# Patient Record
Sex: Female | Born: 1990 | Race: Black or African American | Hispanic: No | Marital: Single | Smoking: Never smoker
Health system: Southern US, Community
[De-identification: ages and names within clinical notes are randomized; demographics above are authoritative.]

## PROBLEM LIST (undated history)

## (undated) DIAGNOSIS — J45909 Unspecified asthma, uncomplicated: Secondary | ICD-10-CM

## (undated) HISTORY — PX: ADENOIDECTOMY: SUR15

## (undated) HISTORY — PX: TONSILLECTOMY: SUR1361

---

## 2017-08-10 ENCOUNTER — Encounter: Payer: Self-pay | Admitting: Physician Assistant

## 2017-08-10 ENCOUNTER — Ambulatory Visit: Payer: Self-pay | Admitting: Physician Assistant

## 2017-08-10 VITALS — BP 100/70 | HR 73 | Temp 98.5°F

## 2017-08-10 DIAGNOSIS — S8001XA Contusion of right knee, initial encounter: Secondary | ICD-10-CM

## 2017-08-10 DIAGNOSIS — S8011XA Contusion of right lower leg, initial encounter: Principal | ICD-10-CM

## 2017-08-10 NOTE — Progress Notes (Signed)
S: pt states she fell down steps at home, leg got caught b/n 2 metal rails, now its bruised and swollen, did use ice last night, no other injury, no bcp use, nonsmoker  O: vitals wnl nad, skin is bruised and tender in posterior to medial aspect of r knee, no bony tenderness, n/v intact, knee brace applied  A: contusion  P: ice, advil, elevate, wear knee brace as needed, if leg becomes more swollen or painful go to the ER or return here and will order US of lower leg, pt states she understands and knows the risk of not going to ER or here if worsening

## 2019-05-22 ENCOUNTER — Other Ambulatory Visit: Payer: Self-pay

## 2019-05-22 DIAGNOSIS — Z20822 Contact with and (suspected) exposure to covid-19: Secondary | ICD-10-CM

## 2019-05-23 LAB — NOVEL CORONAVIRUS, NAA: SARS-CoV-2, NAA: NOT DETECTED

## 2019-11-27 ENCOUNTER — Encounter: Payer: Self-pay | Admitting: Nurse Practitioner

## 2019-11-27 ENCOUNTER — Other Ambulatory Visit: Payer: Self-pay

## 2019-11-27 ENCOUNTER — Ambulatory Visit: Payer: Managed Care, Other (non HMO) | Admitting: Nurse Practitioner

## 2019-11-27 VITALS — BP 110/80 | HR 73 | Temp 98.4°F | Ht 64.0 in | Wt 161.2 lb

## 2019-11-27 DIAGNOSIS — Z0000 Encounter for general adult medical examination without abnormal findings: Secondary | ICD-10-CM | POA: Diagnosis not present

## 2019-11-27 DIAGNOSIS — R1032 Left lower quadrant pain: Secondary | ICD-10-CM | POA: Diagnosis not present

## 2019-11-27 DIAGNOSIS — R319 Hematuria, unspecified: Secondary | ICD-10-CM

## 2019-11-27 DIAGNOSIS — Z113 Encounter for screening for infections with a predominantly sexual mode of transmission: Secondary | ICD-10-CM

## 2019-11-27 LAB — POCT URINALYSIS DIPSTICK
Bilirubin, UA: NEGATIVE
Glucose, UA: NEGATIVE
Ketones, UA: NEGATIVE
Leukocytes, UA: NEGATIVE
Nitrite, UA: NEGATIVE
Protein, UA: NEGATIVE
Spec Grav, UA: 1.02 (ref 1.010–1.025)
Urobilinogen, UA: 0.2 E.U./dL
pH, UA: 6 (ref 5.0–8.0)

## 2019-11-27 NOTE — Progress Notes (Signed)
This visit occurred during the SARS-CoV-2 public health emergency.  Safety protocols were in place, including screening questions prior to the visit, additional usage of staff PPE, and extensive cleaning of exam room while observing appropriate contact time as indicated for disinfecting solutions.  Subjective:     Patient ID: Destiny Ware , female    DOB: 03-Feb-1991 , 29 y.o.   MRN: [id number]   Chief Complaint  Patient presents with  . Establish Care  . Annual Exam    HPI  Here to establish care. She found [place] through her insurance.  She is originally from [facility], has been in the area for 4 years. Works in an office.  Single, no children.  College education for 1 year.   PMH - none  FMH - mother and father both healthy.  2 sisters and 2 brothers all healthy. Paternal grandmother - possible HTN.   She had left groin muscle pain. Pain was present for 1 week.  Occurred upon awakening. Was seen at [facility].  She would like to have her annual physical today    The patient states she uses none for birth control. Last LMP was Patient's last menstrual period was 11/07/2019.. Negative for Dysmenorrhea and Negative for Menorrhagia.  Negative for: breast discharge, breast lump(s), breast pain and breast self exam.  Pertinent negatives include abnormal bleeding (hematology), anxiety, decreased libido, depression, difficulty falling sleep, dyspareunia, history of infertility, nocturia, sexual dysfunction, sleep disturbances, urinary incontinence, urinary urgency, vaginal discharge and vaginal itching. Diet regular. The patient states her exercise level is     The patient's tobacco use is:  Social History   Tobacco Use  Smoking Status Never Smoker  Smokeless Tobacco Never Used   She has been exposed to passive smoke. The patient's alcohol use is:  Social History   Substance and Sexual Activity  Alcohol Use Yes   Comment: occasionally   Additional information: Last pap  unknown,   History reviewed. No pertinent past medical history.   Family History  Problem Relation Age of Onset  . Healthy Mother   . Healthy Father   . Healthy Sister   . Healthy Sister   . Healthy Brother   . Healthy Brother     No current outpatient medications on file.   No Known Allergies   Review of Systems  Constitutional: Negative.   HENT: Negative.   Eyes: Negative.   Respiratory: Negative.   Cardiovascular: Negative.   Gastrointestinal: Negative.   Endocrine: Negative.   Genitourinary: Negative.   Musculoskeletal: Negative.   Skin: Negative.   Allergic/Immunologic: Negative.   Neurological: Negative.   Hematological: Negative.   Psychiatric/Behavioral: Negative.      Today's Vitals   11/27/19 1501  BP: 110/80  Pulse: 73  Temp: 98.4 F (36.9 C)  TempSrc: Oral  SpO2: 98%  Weight: 161 lb 3.2 oz (73.1 kg)  Height: 5\' 4"  (1.626 m)  PainSc: 0-No pain   Body mass index is 27.67 kg/m.   Objective:  Physical Exam Constitutional:      Appearance: Normal appearance. She is well-developed.  HENT:     Head: Normocephalic and atraumatic.     Right Ear: Hearing, tympanic membrane, ear canal and external ear normal. There is no impacted cerumen.     Left Ear: Hearing, tympanic membrane, ear canal and external ear normal. There is no impacted cerumen.     Nose:     Comments: Deferred - masked    Mouth/Throat:  Comments: Deferred - masked Eyes:     General: Lids are normal.     Extraocular Movements: Extraocular movements intact.     Conjunctiva/sclera: Conjunctivae normal.     Pupils: Pupils are equal, round, and reactive to light.     Funduscopic exam:    Right eye: No papilledema.        Left eye: No papilledema.  Neck:     Thyroid: No thyroid mass.     Vascular: No carotid bruit.  Cardiovascular:     Rate and Rhythm: Normal rate and regular rhythm.     Pulses: Normal pulses.     Heart sounds: Normal heart sounds. No murmur.  Pulmonary:      Effort: Pulmonary effort is normal.     Breath sounds: Normal breath sounds.  Chest:     Breasts:        Right: Normal. No mass or tenderness.        Left: Normal. No mass or tenderness.  Abdominal:     General: Abdomen is flat. Bowel sounds are normal.     Palpations: Abdomen is soft.  Musculoskeletal:        General: No swelling. Normal range of motion.     Cervical back: Full passive range of motion without pain, normal range of motion and neck supple.     Right lower leg: No edema.     Left lower leg: No edema.  Lymphadenopathy:     Upper Body:     Right upper body: No supraclavicular adenopathy.     Left upper body: No supraclavicular adenopathy.  Skin:    General: Skin is warm and dry.     Capillary Refill: Capillary refill takes less than 2 seconds.  Neurological:     General: No focal deficit present.     Mental Status: She is alert and oriented to person, place, and time.     Cranial Nerves: No cranial nerve deficit.     Sensory: No sensory deficit.  Psychiatric:        Mood and Affect: Mood normal.        Behavior: Behavior normal.        Thought Content: Thought content normal.        Judgment: Judgment normal.         Assessment And Plan:     1. Health maintenance examination . Behavior modifications discussed and diet history reviewed.   . Pt will continue to exercise regularly and modify diet with low GI, plant based foods and decrease intake of processed foods.  . Recommend intake of daily multivitamin, Vitamin D, and calcium.  . Recommend routine breast self exams monthly for preventive screenings, as well as recommend immunizations that include influenza, TDAP (she will try to get a copy of her records) . Declined to have a PAP today  2. Left groin pain  No pain on palpation  Does have moderate blood in urine.  Will recheck at next visit   Minette Brine, FNP    THE PATIENT IS ENCOURAGED TO PRACTICE SOCIAL DISTANCING DUE TO THE COVID-19 PANDEMIC.

## 2019-11-27 NOTE — Patient Instructions (Signed)
Health Maintenance  Topic Date Due  . HIV Screening  04/04/2007  . TETANUS/TDAP  04/04/2011  . PAP-Cervical Cytology Screening  04/03/2013  . PAP SMEAR-Modifier  04/03/2013  . INFLUENZA VACCINE  01/14/2020 (Originally 05/17/2019)   Health Maintenance, Female Adopting a healthy lifestyle and getting preventive care are important in promoting health and wellness. Ask your health care provider about:  The right schedule for you to have regular tests and exams.  Things you can do on your own to prevent diseases and keep yourself healthy. What should I know about diet, weight, and exercise? Eat a healthy diet   Eat a diet that includes plenty of vegetables, fruits, low-fat dairy products, and lean protein.  Do not eat a lot of foods that are high in solid fats, added sugars, or sodium. Maintain a healthy weight Body mass index (BMI) is used to identify weight problems. It estimates body fat based on height and weight. Your health care provider can help determine your BMI and help you achieve or maintain a healthy weight. Get regular exercise Get regular exercise. This is one of the most important things you can do for your health. Most adults should:  Exercise for at least 150 minutes each week. The exercise should increase your heart rate and make you sweat (moderate-intensity exercise).  Do strengthening exercises at least twice a week. This is in addition to the moderate-intensity exercise.  Spend less time sitting. Even light physical activity can be beneficial. Watch cholesterol and blood lipids Have your blood tested for lipids and cholesterol at 28 years of age, then have this test every 5 years. Have your cholesterol levels checked more often if:  Your lipid or cholesterol levels are high.  You are older than 28 years of age.  You are at high risk for heart disease. What should I know about cancer screening? Depending on your health history and family history, you may need  to have cancer screening at various ages. This may include screening for:  Breast cancer.  Cervical cancer.  Colorectal cancer.  Skin cancer.  Lung cancer. What should I know about heart disease, diabetes, and high blood pressure? Blood pressure and heart disease  High blood pressure causes heart disease and increases the risk of stroke. This is more likely to develop in people who have high blood pressure readings, are of African descent, or are overweight.  Have your blood pressure checked: ? Every 3-5 years if you are 75-14 years of age. ? Every year if you are 76 years old or older. Diabetes Have regular diabetes screenings. This checks your fasting blood sugar level. Have the screening done:  Once every three years after age 46 if you are at a normal weight and have a low risk for diabetes.  More often and at a younger age if you are overweight or have a high risk for diabetes. What should I know about preventing infection? Hepatitis B If you have a higher risk for hepatitis B, you should be screened for this virus. Talk with your health care provider to find out if you are at risk for hepatitis B infection. Hepatitis C Testing is recommended for:  Everyone born from [birth year] through 1964.  Anyone with known risk factors for hepatitis C. Sexually transmitted infections (STIs)  Get screened for STIs, including gonorrhea and chlamydia, if: ? You are sexually active and are younger than 28 years of age. ? You are older than 28 years of age and your health care  provider tells you that you are at risk for this type of infection. ? Your sexual activity has changed since you were last screened, and you are at increased risk for chlamydia or gonorrhea. Ask your health care provider if you are at risk.  Ask your health care provider about whether you are at high risk for HIV. Your health care provider may recommend a prescription medicine to help prevent HIV infection. If you choose  to take medicine to prevent HIV, you should first get tested for HIV. You should then be tested every 3 months for as long as you are taking the medicine. Pregnancy  If you are about to stop having your period (premenopausal) and you may become pregnant, seek counseling before you get pregnant.  Take 400 to 800 micrograms (mcg) of folic acid every day if you become pregnant.  Ask for birth control (contraception) if you want to prevent pregnancy. Osteoporosis and menopause Osteoporosis is a disease in which the bones lose minerals and strength with aging. This can result in bone fractures. If you are 30 years old or older, or if you are at risk for osteoporosis and fractures, ask your health care provider if you should:  Be screened for bone loss.  Take a calcium or vitamin D supplement to lower your risk of fractures.  Be given hormone replacement therapy (HRT) to treat symptoms of menopause. Follow these instructions at home: Lifestyle  Do not use any products that contain nicotine or tobacco, such as cigarettes, e-cigarettes, and chewing tobacco. If you need help quitting, ask your health care provider.  Do not use street drugs.  Do not share needles.  Ask your health care provider for help if you need support or information about quitting drugs. Alcohol use  Do not drink alcohol if: ? Your health care provider tells you not to drink. ? You are pregnant, may be pregnant, or are planning to become pregnant.  If you drink alcohol: ? Limit how much you use to 0-1 drink a day. ? Limit intake if you are breastfeeding.  Be aware of how much alcohol is in your drink. In the [place] one drink equals one 12 oz bottle of beer (355 mL), one 5 oz glass of wine (148 mL), or one 1 oz glass of hard liquor (44 mL). General instructions  Schedule regular health, dental, and eye exams.  Stay current with your vaccines.  Tell your health care provider if: ? You often feel depressed. ? You  have ever been abused or do not feel safe at home. Summary  Adopting a healthy lifestyle and getting preventive care are important in promoting health and wellness.  Follow your health care provider's instructions about healthy diet, exercising, and getting tested or screened for diseases.  Follow your health care provider's instructions on monitoring your cholesterol and blood pressure. This information is not intended to replace advice given to you by your health care provider. Make sure you discuss any questions you have with your health care provider. Document Revised: 09/25/2018 Document Reviewed: 09/25/2018 Elsevier Patient Education  2020 [employer].

## 2019-11-28 LAB — HIV ANTIBODY (ROUTINE TESTING W REFLEX): HIV Screen 4th Generation wRfx: NONREACTIVE

## 2019-11-28 LAB — CMP14+EGFR
ALT: 9 IU/L (ref 0–32)
AST: 13 IU/L (ref 0–40)
Albumin/Globulin Ratio: 2.1 (ref 1.2–2.2)
Albumin: 4.6 g/dL (ref 3.9–5.0)
Alkaline Phosphatase: 76 IU/L (ref 39–117)
BUN/Creatinine Ratio: 10 (ref 9–23)
BUN: 10 mg/dL (ref 6–20)
Bilirubin Total: 0.6 mg/dL (ref 0.0–1.2)
CO2: 22 mmol/L (ref 20–29)
Calcium: 9.6 mg/dL (ref 8.7–10.2)
Chloride: 105 mmol/L (ref 96–106)
Creatinine, Ser: 0.97 mg/dL (ref 0.57–1.00)
GFR calc Af Amer: 93 mL/min/{1.73_m2} (ref >59)
GFR calc non Af Amer: 80 mL/min/{1.73_m2} (ref >59)
Globulin, Total: 2.2 g/dL (ref 1.5–4.5)
Glucose: 97 mg/dL (ref 65–99)
Potassium: 4.6 mmol/L (ref 3.5–5.2)
Sodium: 141 mmol/L (ref 134–144)
Total Protein: 6.8 g/dL (ref 6.0–8.5)

## 2019-11-28 LAB — URINE CULTURE

## 2019-11-28 LAB — CBC
Hematocrit: 37.7 % (ref 34.0–46.6)
Hemoglobin: 12.8 g/dL (ref 11.1–15.9)
MCH: 30.8 pg (ref 26.6–33.0)
MCHC: 34 g/dL (ref 31.5–35.7)
MCV: 91 fL (ref 79–97)
Platelets: 203 10*3/uL (ref 150–450)
RBC: 4.15 x10E6/uL (ref 3.77–5.28)
RDW: 12.6 % (ref 11.7–15.4)
WBC: 4.4 10*3/uL (ref 3.4–10.8)

## 2019-11-28 LAB — LIPID PANEL
Chol/HDL Ratio: 1.9 ratio (ref 0.0–4.4)
Cholesterol, Total: 163 mg/dL (ref 100–199)
HDL: 87 mg/dL (ref >39)
LDL Chol Calc (NIH): 66 mg/dL (ref 0–99)
Triglycerides: 44 mg/dL (ref 0–149)
VLDL Cholesterol Cal: 10 mg/dL (ref 5–40)

## 2019-11-28 LAB — VITAMIN D 25 HYDROXY (VIT D DEFICIENCY, FRACTURES): Vit D, 25-Hydroxy: 6.8 ng/mL — ABNORMAL LOW (ref 30.0–100.0)

## 2019-12-01 MED ORDER — VITAMIN D (ERGOCALCIFEROL) 1.25 MG (50000 UNIT) PO CAPS: 50000.0000 [IU] | ORAL_CAPSULE | ORAL | 1 refills | Status: DC

## 2020-06-23 ENCOUNTER — Encounter: Payer: Self-pay | Admitting: Nurse Practitioner

## 2020-06-23 ENCOUNTER — Ambulatory Visit (INDEPENDENT_AMBULATORY_CARE_PROVIDER_SITE_OTHER): Payer: Managed Care, Other (non HMO) | Admitting: Nurse Practitioner

## 2020-06-23 ENCOUNTER — Other Ambulatory Visit: Payer: Self-pay

## 2020-06-23 VITALS — BP 118/74 | HR 86 | Temp 98.2°F | Ht 64.0 in | Wt 169.0 lb

## 2020-06-23 DIAGNOSIS — R0602 Shortness of breath: Secondary | ICD-10-CM | POA: Diagnosis not present

## 2020-06-23 DIAGNOSIS — E559 Vitamin D deficiency, unspecified: Secondary | ICD-10-CM | POA: Diagnosis not present

## 2020-06-23 MED ORDER — ALBUTEROL SULFATE HFA 108 (90 BASE) MCG/ACT IN AERS: 2.0000 | INHALATION_SPRAY | Freq: Four times a day (QID) | RESPIRATORY_TRACT | 2 refills | Status: AC | PRN

## 2020-06-23 NOTE — Progress Notes (Signed)
This visit occurred during the SARS-CoV-2 public health emergency.  Safety protocols were in place, including screening questions prior to the visit, additional usage of staff PPE, and extensive cleaning of exam room while observing appropriate contact time as indicated for disinfecting solutions.  Subjective:     Patient ID: Destiny Ware , female    DOB: 03-04-1991 , 29 y.o.   MRN: [id number]   Chief Complaint  Patient presents with  . Shortness of Breath    HPI  Reports occurs sitting and on exertion.  She report having random chest discomfort but does not last long. She has had both covid vaccines with the last one she thinks in May.  She reports having some wheezing. Also reports family history of asthma  Shortness of Breath This is a new problem. The current episode started in the past 7 days (since Saturday). The problem occurs constantly. Pertinent negatives include no abdominal pain, chest pain, fever, headaches, leg swelling, orthopnea, sore throat, sputum production or syncope. She has tried nothing for the symptoms. There is no history of allergies or asthma.     History reviewed. No pertinent past medical history.   Family History  Problem Relation Age of Onset  . Healthy Mother   . Healthy Father   . Healthy Sister   . Healthy Sister   . Healthy Brother   . Healthy Brother      Current Outpatient Medications:  .  albuterol (VENTOLIN HFA) 108 (90 Base) MCG/ACT inhaler, Inhale 2 puffs into the lungs every 6 (six) hours as needed for wheezing or shortness of breath., Disp: 8 g, Rfl: 2 .  Vitamin D, Ergocalciferol, (DRISDOL) 1.25 MG (50000 UNIT) CAPS capsule, Take 1 capsule (50,000 Units total) by mouth 2 (two) times a week. (Patient not taking: Reported on 06/23/2020), Disp: 24 capsule, Rfl: 1   No Known Allergies   Review of Systems  Constitutional: Negative for fever.  HENT: Negative for sore throat.   Respiratory: Positive for shortness of breath. Negative for  sputum production.   Cardiovascular: Negative for chest pain, orthopnea, leg swelling and syncope.  Gastrointestinal: Negative for abdominal pain.  Neurological: Negative for dizziness and headaches.     Today's Vitals   06/23/20 1606  BP: 118/74  Pulse: 86  Temp: 98.2 F (36.8 C)  TempSrc: Oral  SpO2: 99%  Weight: 169 lb (76.7 kg)  Height: 5\' 4"  (1.626 m)  PainSc: 0-No pain   Body mass index is 29.01 kg/m.   Objective:  Physical Exam Vitals reviewed.  Constitutional:      Appearance: She is well-developed.  Pulmonary:     Breath sounds: No decreased breath sounds or wheezing.  Neurological:     General: No focal deficit present.     Mental Status: She is alert.     Cranial Nerves: No cranial nerve deficit.  Psychiatric:        Mood and Affect: Mood normal.        Behavior: Behavior normal.         Assessment And Plan:     1. Shortness of breath  No abnormal findings on physical exam   She is to go for a chest xray to evaluate further  She is to go after she receives the results of her covid test - EKG 12-Lead - Novel Coronavirus, NAA ([facility]) - DG Chest 2 View; Future - albuterol (VENTOLIN HFA) 108 (90 Base) MCG/ACT inhaler; Inhale 2 puffs into the lungs every 6 (six) hours  as needed for wheezing or shortness of breath.  Dispense: 8 g; Refill: 2 - Vitamin D (25 hydroxy)  2. Vitamin D deficiency  Will check vitamin D level and supplement as needed.     Also encouraged to spend 15 minutes in the sun daily.     Patient was given opportunity to ask questions. Patient verbalized understanding of the plan and was able to repeat key elements of the plan. All questions were answered to their satisfaction.   Jeanell Sparrow, FNP, have reviewed all documentation for this visit. The documentation on 06/23/20 for the exam, diagnosis, procedures, and orders are all accurate and complete.  THE PATIENT IS ENCOURAGED TO PRACTICE SOCIAL DISTANCING DUE TO THE COVID-19  PANDEMIC.

## 2020-06-23 NOTE — Patient Instructions (Signed)
Stay home in isolation until you have a negative Covid test.   Go to chest xray at [facility] at [address].

## 2020-06-24 ENCOUNTER — Other Ambulatory Visit: Payer: Self-pay | Admitting: Nurse Practitioner

## 2020-06-24 DIAGNOSIS — E559 Vitamin D deficiency, unspecified: Secondary | ICD-10-CM

## 2020-06-24 LAB — VITAMIN D 25 HYDROXY (VIT D DEFICIENCY, FRACTURES): Vit D, 25-Hydroxy: 61.1 ng/mL (ref 30.0–100.0)

## 2020-06-24 MED ORDER — VITAMIN D (ERGOCALCIFEROL) 1.25 MG (50000 UNIT) PO CAPS: 50000.0000 [IU] | ORAL_CAPSULE | ORAL | 1 refills | Status: DC

## 2020-06-25 LAB — SARS-COV-2, NAA 2 DAY TAT

## 2020-06-25 LAB — NOVEL CORONAVIRUS, NAA: SARS-CoV-2, NAA: NOT DETECTED

## 2020-09-11 ENCOUNTER — Ambulatory Visit (HOSPITAL_COMMUNITY)
Admission: EM | Admit: 2020-09-11 | Discharge: 2020-09-11 | Disposition: A | Discharge status: Home / Self Care | Payer: Managed Care, Other (non HMO) | Attending: Family Medicine | Admitting: Family Medicine

## 2020-09-11 ENCOUNTER — Encounter (HOSPITAL_COMMUNITY): Payer: Self-pay

## 2020-09-11 ENCOUNTER — Other Ambulatory Visit: Payer: Self-pay

## 2020-09-11 DIAGNOSIS — J209 Acute bronchitis, unspecified: Secondary | ICD-10-CM | POA: Diagnosis not present

## 2020-09-11 DIAGNOSIS — R5383 Other fatigue: Secondary | ICD-10-CM | POA: Diagnosis not present

## 2020-09-11 DIAGNOSIS — Z1152 Encounter for screening for COVID-19: Secondary | ICD-10-CM | POA: Diagnosis not present

## 2020-09-11 DIAGNOSIS — R059 Cough, unspecified: Secondary | ICD-10-CM | POA: Diagnosis not present

## 2020-09-11 LAB — RESP PANEL BY RT-PCR (FLU A&B, COVID) ARPGX2
Influenza A by PCR: NEGATIVE
Influenza B by PCR: NEGATIVE
SARS Coronavirus 2 by RT PCR: NEGATIVE

## 2020-09-11 MED ORDER — BENZONATATE 100 MG PO CAPS: 100.0000 mg | ORAL_CAPSULE | Freq: Three times a day (TID) | ORAL | 0 refills | Status: DC

## 2020-09-11 MED ORDER — PREDNISONE 10 MG (21) PO TBPK: ORAL_TABLET | Freq: Every day | ORAL | 0 refills | Status: AC

## 2020-09-11 NOTE — ED Provider Notes (Signed)
[facility]   [id number] 09/11/20 Arrival Time: 1705   CC: COVID symptoms  SUBJECTIVE: History from: patient.  Destiny Ware is a 29 y.o. female who presents with abrupt onset of nasal congestion, PND, sore throat, headache and persistent dry cough for the last 3 days. Denies sick exposure to COVID, flu or strep. Denies recent travel. Has negative history of Covid. Has not completed Covid vaccines. Has not taken OTC medications for this.  Reports that she has an inhaler, but when she uses that she vomits.  Fatigue and cough are worse with activity. Denies previous symptoms in the past. Denies fever, chills,  sinus pain, rhinorrhea, SOB, chest pain, nausea, changes in bowel or bladder habits.    ROS: As per HPI.  All other pertinent ROS negative.     History reviewed. No pertinent past medical history. History reviewed. No pertinent surgical history. No Known Allergies No current facility-administered medications on file prior to encounter.   Current Outpatient Medications on File Prior to Encounter  Medication Sig Dispense Refill  . albuterol (VENTOLIN HFA) 108 (90 Base) MCG/ACT inhaler Inhale 2 puffs into the lungs every 6 (six) hours as needed for wheezing or shortness of breath. 8 g 2  . Vitamin D, Ergocalciferol, (DRISDOL) 1.25 MG (50000 UNIT) CAPS capsule Take 1 capsule (50,000 Units total) by mouth every 7 (seven) days. 12 capsule 1   Social History   Socioeconomic History  . Marital status: Single    Spouse name: Not on file  . Number of children: Not on file  . Years of education: Not on file  . Highest education level: Not on file  Occupational History  . Not on file  Tobacco Use  . Smoking status: Never Smoker  . Smokeless tobacco: Never Used  Substance and Sexual Activity  . Alcohol use: Yes    Comment: occasionally   . Drug use: Never  . Sexual activity: Not on file  Other Topics Concern  . Not on file  Social History Narrative  . Not on file    Social Determinants of Health   Financial Resource Strain:   . Difficulty of Paying Living Expenses: Not on file  Food Insecurity:   . Worried About Programme researcher, broadcasting/film/video in the Last Year: Not on file  . Ran Out of Food in the Last Year: Not on file  Transportation Needs:   . Lack of Transportation (Medical): Not on file  . Lack of Transportation (Non-Medical): Not on file  Physical Activity:   . Days of Exercise per Week: Not on file  . Minutes of Exercise per Session: Not on file  Stress:   . Feeling of Stress : Not on file  Social Connections:   . Frequency of Communication with Friends and Family: Not on file  . Frequency of Social Gatherings with Friends and Family: Not on file  . Attends Religious Services: Not on file  . Active Member of Clubs or Organizations: Not on file  . Attends Banker Meetings: Not on file  . Marital Status: Not on file  Intimate Partner Violence:   . Fear of Current or Ex-Partner: Not on file  . Emotionally Abused: Not on file  . Physically Abused: Not on file  . Sexually Abused: Not on file   Family History  Problem Relation Age of Onset  . Healthy Mother   . Healthy Father   . Healthy Sister   . Healthy Sister   . Healthy Brother   .  Healthy Brother     OBJECTIVE:  Vitals:   09/11/20 1734  BP: (!) 115/53  Pulse: 91  Resp: 16  Temp: 98.2 F (36.8 C)  TempSrc: Oral  SpO2: 99%     General appearance: alert; appears fatigued, but nontoxic; speaking in full sentences and tolerating own secretions HEENT: NCAT; Ears: EACs clear, TMs pearly gray; Eyes: PERRL.  EOM grossly intact. Sinuses: nontender; Nose: nares patent without rhinorrhea, Throat: oropharynx mildly erythematous, cobblestoning present, tonsils non erythematous or enlarged, uvula midline  Neck: supple with LAD Lungs: unlabored respirations, symmetrical air entry; cough: absent; no respiratory distress; wheezing noted to bilateral lower lobes, otherwise  CTA Heart: regular rate and rhythm.  Radial pulses 2+ symmetrical bilaterally Skin: warm and dry Psychological: alert and cooperative; normal mood and affect  LABS:  No results found for this or any previous visit (from the past 24 hour(s)).   ASSESSMENT & PLAN:  1. Acute bronchitis, unspecified organism   2. Encounter for screening for COVID-19   3. Cough   4. Other fatigue     Meds ordered this encounter  Medications  . predniSONE (STERAPRED UNI-PAK 21 TAB) 10 MG (21) TBPK tablet    Sig: Take by mouth daily for 6 days. Take 6 tablets on day 1, 5 tablets on day 2, 4 tablets on day 3, 3 tablets on day 4, 2 tablets on day 5, 1 tablet on day 6    Dispense:  21 tablet    Refill:  0    Order Specific Question:   Supervising Provider    Answer:   Merrilee Jansky [id number]  . benzonatate (TESSALON) 100 MG capsule    Sig: Take 1 capsule (100 mg total) by mouth every 8 (eight) hours.    Dispense:  21 capsule    Refill:  0    Order Specific Question:   Supervising Provider    Answer:   Merrilee Jansky [[id number]]   Prescribed steroid taper Prescribed benzonatate Work note provided Respiratory viral panel testing ordered.  It will take between 1-2 days for test results.  Someone will contact you regarding abnormal results.    Patient should remain in quarantine until they have received Covid results.  If negative you may resume normal activities (go back to work/school) while practicing hand hygiene, social distance, and mask wearing.  If positive, patient should remain in quarantine for 10 days from symptom onset AND greater than 72 hours after symptoms resolution (absence of fever without the use of fever-reducing medication and improvement in respiratory symptoms), whichever is longer Get plenty of rest and push fluids Use OTC zyrtec for nasal congestion, runny nose, and/or sore throat Use OTC flonase for nasal congestion and runny nose Use medications daily for symptom  relief Use OTC medications like ibuprofen or tylenol as needed fever or pain Call or go to the ED if you have any new or worsening symptoms such as fever, worsening cough, shortness of breath, chest tightness, chest pain, turning blue, changes in mental status.  Reviewed expectations re: course of current medical issues. Questions answered. Outlined signs and symptoms indicating need for more acute intervention. Patient verbalized understanding. After Visit Summary given.         Moshe Cipro, NP 09/11/20 1837

## 2020-09-11 NOTE — ED Triage Notes (Addendum)
Pt present non productive cough with fatigue, symptoms started three days ago.

## 2020-09-11 NOTE — Discharge Instructions (Signed)
I have sent in a prednisone taper for you to take for 6 days. 6 tablets on day one, 5 tablets on day two, 4 tablets on day three, 3 tablets on day four, 2 tablets on day five, and 1 tablet on day six.  I have sent in tessalon perles for you to use one capsule every 8 hours as needed for cough.  I am going to treat you for now like you have bronchitis.  Your COVID and Flu tests are pending.  You should self quarantine until the test results are back.    Take Tylenol or ibuprofen as needed for fever or discomfort.  Rest and keep yourself hydrated.    Follow-up with your primary care provider if your symptoms are not improving.

## 2020-11-29 ENCOUNTER — Encounter: Payer: Self-pay | Admitting: Nurse Practitioner

## 2020-11-29 ENCOUNTER — Ambulatory Visit (INDEPENDENT_AMBULATORY_CARE_PROVIDER_SITE_OTHER): Payer: Managed Care, Other (non HMO) | Admitting: Nurse Practitioner

## 2020-11-29 ENCOUNTER — Other Ambulatory Visit (HOSPITAL_COMMUNITY)
Admission: RE | Admit: 2020-11-29 | Discharge: 2020-11-29 | Disposition: A | Discharge status: Home / Self Care | Payer: Managed Care, Other (non HMO) | Source: Ambulatory Visit | Attending: Nurse Practitioner | Admitting: Nurse Practitioner

## 2020-11-29 ENCOUNTER — Other Ambulatory Visit: Payer: Self-pay

## 2020-11-29 VITALS — BP 122/76 | HR 69 | Temp 98.1°F | Ht 64.8 in | Wt 176.2 lb

## 2020-11-29 DIAGNOSIS — Z0000 Encounter for general adult medical examination without abnormal findings: Secondary | ICD-10-CM | POA: Diagnosis not present

## 2020-11-29 DIAGNOSIS — Z113 Encounter for screening for infections with a predominantly sexual mode of transmission: Secondary | ICD-10-CM | POA: Diagnosis not present

## 2020-11-29 DIAGNOSIS — B9689 Other specified bacterial agents as the cause of diseases classified elsewhere: Secondary | ICD-10-CM | POA: Diagnosis not present

## 2020-11-29 DIAGNOSIS — E559 Vitamin D deficiency, unspecified: Secondary | ICD-10-CM

## 2020-11-29 DIAGNOSIS — N760 Acute vaginitis: Secondary | ICD-10-CM | POA: Diagnosis not present

## 2020-11-29 DIAGNOSIS — Z1159 Encounter for screening for other viral diseases: Secondary | ICD-10-CM

## 2020-11-29 DIAGNOSIS — Z2821 Immunization not carried out because of patient refusal: Secondary | ICD-10-CM | POA: Diagnosis not present

## 2020-11-29 DIAGNOSIS — Z124 Encounter for screening for malignant neoplasm of cervix: Secondary | ICD-10-CM | POA: Insufficient documentation

## 2020-11-29 DIAGNOSIS — Z114 Encounter for screening for human immunodeficiency virus [HIV]: Secondary | ICD-10-CM

## 2020-11-29 MED ORDER — CETIRIZINE HCL 10 MG PO TABS: 10.0000 mg | ORAL_TABLET | Freq: Every day | ORAL | 2 refills | Status: DC

## 2020-11-29 NOTE — Patient Instructions (Signed)
Health Maintenance, Female Adopting a healthy lifestyle and getting preventive care are important in promoting health and wellness. Ask your health care provider about:  The right schedule for you to have regular tests and exams.  Things you can do on your own to prevent diseases and keep yourself healthy. What should I know about diet, weight, and exercise? Eat a healthy diet  Eat a diet that includes plenty of vegetables, fruits, low-fat dairy products, and lean protein.  Do not eat a lot of foods that are high in solid fats, added sugars, or sodium.   Maintain a healthy weight Body mass index (BMI) is used to identify weight problems. It estimates body fat based on height and weight. Your health care provider can help determine your BMI and help you achieve or maintain a healthy weight. Get regular exercise Get regular exercise. This is one of the most important things you can do for your health. Most adults should:  Exercise for at least 150 minutes each week. The exercise should increase your heart rate and make you sweat (moderate-intensity exercise).  Do strengthening exercises at least twice a week. This is in addition to the moderate-intensity exercise.  Spend less time sitting. Even light physical activity can be beneficial. Watch cholesterol and blood lipids Have your blood tested for lipids and cholesterol at 29 years of age, then have this test every 5 years. Have your cholesterol levels checked more often if:  Your lipid or cholesterol levels are high.  You are older than 29 years of age.  You are at high risk for heart disease. What should I know about cancer screening? Depending on your health history and family history, you may need to have cancer screening at various ages. This may include screening for:  Breast cancer.  Cervical cancer.  Colorectal cancer.  Skin cancer.  Lung cancer. What should I know about heart disease, diabetes, and high blood  pressure? Blood pressure and heart disease  High blood pressure causes heart disease and increases the risk of stroke. This is more likely to develop in people who have high blood pressure readings, are of African descent, or are overweight.  Have your blood pressure checked: ? Every 3-5 years if you are 18-39 years of age. ? Every year if you are 40 years old or older. Diabetes Have regular diabetes screenings. This checks your fasting blood sugar level. Have the screening done:  Once every three years after age 40 if you are at a normal weight and have a low risk for diabetes.  More often and at a younger age if you are overweight or have a high risk for diabetes. What should I know about preventing infection? Hepatitis B If you have a higher risk for hepatitis B, you should be screened for this virus. Talk with your health care provider to find out if you are at risk for hepatitis B infection. Hepatitis C Testing is recommended for:  Everyone born from 1945 through 1965.  Anyone with known risk factors for hepatitis C. Sexually transmitted infections (STIs)  Get screened for STIs, including gonorrhea and chlamydia, if: ? You are sexually active and are younger than 29 years of age. ? You are older than 29 years of age and your health care provider tells you that you are at risk for this type of infection. ? Your sexual activity has changed since you were last screened, and you are at increased risk for chlamydia or gonorrhea. Ask your health care provider   if you are at risk.  Ask your health care provider about whether you are at high risk for HIV. Your health care provider may recommend a prescription medicine to help prevent HIV infection. If you choose to take medicine to prevent HIV, you should first get tested for HIV. You should then be tested every 3 months for as long as you are taking the medicine. Pregnancy  If you are about to stop having your period (premenopausal) and  you may become pregnant, seek counseling before you get pregnant.  Take 400 to 800 micrograms (mcg) of folic acid every day if you become pregnant.  Ask for birth control (contraception) if you want to prevent pregnancy. Osteoporosis and menopause Osteoporosis is a disease in which the bones lose minerals and strength with aging. This can result in bone fractures. If you are 65 years old or older, or if you are at risk for osteoporosis and fractures, ask your health care provider if you should:  Be screened for bone loss.  Take a calcium or vitamin D supplement to lower your risk of fractures.  Be given hormone replacement therapy (HRT) to treat symptoms of menopause. Follow these instructions at home: Lifestyle  Do not use any products that contain nicotine or tobacco, such as cigarettes, e-cigarettes, and chewing tobacco. If you need help quitting, ask your health care provider.  Do not use street drugs.  Do not share needles.  Ask your health care provider for help if you need support or information about quitting drugs. Alcohol use  Do not drink alcohol if: ? Your health care provider tells you not to drink. ? You are pregnant, may be pregnant, or are planning to become pregnant.  If you drink alcohol: ? Limit how much you use to 0-1 drink a day. ? Limit intake if you are breastfeeding.  Be aware of how much alcohol is in your drink. In the [place] one drink equals one 12 oz bottle of beer (355 mL), one 5 oz glass of wine (148 mL), or one 1 oz glass of hard liquor (44 mL). General instructions  Schedule regular health, dental, and eye exams.  Stay current with your vaccines.  Tell your health care provider if: ? You often feel depressed. ? You have ever been abused or do not feel safe at home. Summary  Adopting a healthy lifestyle and getting preventive care are important in promoting health and wellness.  Follow your health care provider's instructions about healthy  diet, exercising, and getting tested or screened for diseases.  Follow your health care provider's instructions on monitoring your cholesterol and blood pressure. This information is not intended to replace advice given to you by your health care provider. Make sure you discuss any questions you have with your health care provider. Document Revised: 09/25/2018 Document Reviewed: 09/25/2018 Elsevier Patient Education  2021 Elsevier Inc.  

## 2020-11-29 NOTE — Progress Notes (Signed)
I,Yamilka Roman [employer] as a Education administrator for [employer], FNP.,have documented all relevant documentation on the behalf of Minette Brine, FNP,as directed by  Minette Brine, FNP while in the presence of Minette Brine, [place]. This visit occurred during the SARS-CoV-2 public health emergency.  Safety protocols were in place, including screening questions prior to the visit, additional usage of staff PPE, and extensive cleaning of exam room while observing appropriate contact time as indicated for disinfecting solutions.  Subjective:     Patient ID: Destiny Ware , female    DOB: [birth year] , 30 y.o.   MRN: [id number]   Chief Complaint  Patient presents with  . Annual Exam    HPI  Here for hm.   Wt Readings from Last 3 Encounters: 11/29/20 : 176 lb 3.2 oz (79.9 kg) 06/23/20 : 169 lb (76.7 kg) 11/27/19 : 161 lb 3.2 oz (73.1 kg)    History reviewed. No pertinent past medical history.   Family History  Problem Relation Age of Onset  . Healthy Mother   . Healthy Father   . Healthy Sister   . Healthy Sister   . Healthy Brother   . Healthy Brother      Current Outpatient Medications:  .  albuterol (VENTOLIN HFA) 108 (90 Base) MCG/ACT inhaler, Inhale 2 puffs into the lungs every 6 (six) hours as needed for wheezing or shortness of breath., Disp: 8 g, Rfl: 2 .  cetirizine (ZYRTEC ALLERGY) 10 MG tablet, Take 1 tablet (10 mg total) by mouth daily., Disp: 30 tablet, Rfl: 2 .  Vitamin D, Ergocalciferol, (DRISDOL) 1.25 MG (50000 UNIT) CAPS capsule, Take 1 capsule (50,000 Units total) by mouth every 7 (seven) days., Disp: 12 capsule, Rfl: 1   No Known Allergies    The patient states she uses none for birth control. Last LMP 11/20/2020.  Negative for Dysmenorrhea and Negative for Menorrhagia. Denies being sexually active. Negative for: breast discharge, breast lump(s), breast pain and breast self exam. Associated symptoms include abnormal vaginal bleeding. Pertinent negatives include abnormal  bleeding (hematology), anxiety, decreased libido, depression, difficulty falling sleep, dyspareunia, history of infertility, nocturia, sexual dysfunction, sleep disturbances, urinary incontinence, urinary urgency, vaginal discharge and vaginal itching. Diet regular. The patient states her exercise level is minimal, when she is able to get to the gym. She is working night shift  The patient's tobacco use is:  Social History   Tobacco Use  Smoking Status Never Smoker  Smokeless Tobacco Never Used   She has been exposed to passive smoke. The patient's alcohol use is:  Social History   Substance and Sexual Activity  Alcohol Use Yes   Comment: occasionally    Additional information: Last pap unknown, next one scheduled for today.    Review of Systems  Constitutional: Negative.   HENT: Negative.   Eyes: Negative.   Respiratory: Negative.   Cardiovascular: Negative.   Gastrointestinal: Negative.   Endocrine: Negative.   Genitourinary: Negative.   Musculoskeletal: Negative.   Skin: Negative.   Allergic/Immunologic: Negative.   Neurological: Negative.   Hematological: Negative.   Psychiatric/Behavioral: Negative.      Today's Vitals   11/29/20 1404  BP: 122/76  Pulse: 69  Temp: 98.1 F (36.7 C)  TempSrc: Oral  Weight: 176 lb 3.2 oz (79.9 kg)  Height: 5' 4.8" (1.646 m)  PainSc: 0-No pain   Body mass index is 29.5 kg/m.   Objective:  Physical Exam Constitutional:      General: She is not in acute distress.  Appearance: Normal appearance. She is well-developed. She is obese.  HENT:     Head: Normocephalic and atraumatic.     Right Ear: Hearing, tympanic membrane, ear canal and external ear normal. There is no impacted cerumen.     Left Ear: Hearing, tympanic membrane, ear canal and external ear normal. There is no impacted cerumen.     Nose:     Comments: Deferred - masked    Mouth/Throat:     Comments: Deferred - masked Eyes:     General: Lids are normal.      Extraocular Movements: Extraocular movements intact.     Conjunctiva/sclera: Conjunctivae normal.     Pupils: Pupils are equal, round, and reactive to light.     Funduscopic exam:    Right eye: No papilledema.        Left eye: No papilledema.  Neck:     Thyroid: No thyroid mass.     Vascular: No carotid bruit.  Cardiovascular:     Rate and Rhythm: Normal rate and regular rhythm.     Pulses: Normal pulses.     Heart sounds: Normal heart sounds. No murmur heard.   Pulmonary:     Effort: Pulmonary effort is normal. No respiratory distress.     Breath sounds: Normal breath sounds. No wheezing.  Chest:     Chest wall: No mass.  Breasts:     Tanner Score is 5.     Right: Normal. No mass, tenderness, axillary adenopathy or supraclavicular adenopathy.     Left: Normal. No mass, tenderness, axillary adenopathy or supraclavicular adenopathy.    Abdominal:     General: Abdomen is flat. Bowel sounds are normal. There is no distension.     Palpations: Abdomen is soft.     Tenderness: There is no abdominal tenderness.  Genitourinary:    Rectum: Guaiac result negative.  Musculoskeletal:        General: No swelling. Normal range of motion.     Cervical back: Full passive range of motion without pain, normal range of motion and neck supple.     Right lower leg: No edema.     Left lower leg: No edema.  Lymphadenopathy:     Upper Body:     Right upper body: No supraclavicular, axillary or pectoral adenopathy.     Left upper body: No supraclavicular, axillary or pectoral adenopathy.  Skin:    General: Skin is warm and dry.     Capillary Refill: Capillary refill takes less than 2 seconds.     Comments: Tattoos noted to abdomen of music symbols  Neurological:     General: No focal deficit present.     Mental Status: She is alert and oriented to person, place, and time.     Cranial Nerves: No cranial nerve deficit.     Sensory: No sensory deficit.  Psychiatric:        Mood and Affect:  Mood normal.        Behavior: Behavior normal.        Thought Content: Thought content normal.        Judgment: Judgment normal.         Assessment And Plan:     1. Encounter for general adult medical examination w/o abnormal findings . Behavior modifications discussed and diet history reviewed.   . Pt will continue to exercise regularly and modify diet with low GI, plant based foods and decrease intake of processed foods.  . Recommend intake of daily multivitamin, Vitamin  D, and calcium.  . Recommend for preventive screenings, as well as recommend immunizations that include influenza, TDAP (she will check with her previous employer [employer] for the date) . Discussed importance of self breast exams - CMP14+EGFR - CBC  2. Vitamin D deficiency  Will check vitamin D level and supplement as needed.     Also encouraged to spend 15 minutes in the sun daily.  - VITAMIN D 25 Hydroxy (Vit-D Deficiency, Fractures)  3. Influenza vaccination declined  Patient declined influenza vaccination at this time. Patient is aware that influenza vaccine prevents illness in 70% of healthy people, and reduces hospitalizations to 30-70% in elderly. This vaccine is recommended annually. Pt is willing to accept risk associated with refusing vaccination.  4. Encounter for hepatitis C screening test for low risk patient  Will check Hepatitis C screening due to recent recommendations to screen all adults 18 years and older - Hepatitis C antibody  5. Encounter for Papanicolaou smear of cervix  No abnormal findings - Cytology -Pap Smear - Cervicovaginal ancillary only  6. Screening for STD (sexually transmitted disease) - T pallidum Screening [place]  7. Encounter for HIV (human immunodeficiency virus) test - HIV Antibody (routine testing w rflx)     Patient was given opportunity to ask questions. Patient verbalized understanding of the plan and was able to repeat key elements of the plan. All  questions were answered to their satisfaction.   Minette Brine, FNP    I, Minette Brine, FNP, have reviewed all documentation for this visit. The documentation on 11/29/20 for the exam, diagnosis, procedures, and orders are all accurate and complete.   THE PATIENT IS ENCOURAGED TO PRACTICE SOCIAL DISTANCING DUE TO THE COVID-19 PANDEMIC.

## 2020-11-30 LAB — CMP14+EGFR
ALT: 9 IU/L (ref 0–32)
AST: 9 IU/L (ref 0–40)
Albumin/Globulin Ratio: 1.7 (ref 1.2–2.2)
Albumin: 4.4 g/dL (ref 3.9–5.0)
Alkaline Phosphatase: 79 IU/L (ref 44–121)
BUN/Creatinine Ratio: 12 (ref 9–23)
BUN: 12 mg/dL (ref 6–20)
Bilirubin Total: 0.4 mg/dL (ref 0.0–1.2)
CO2: 23 mmol/L (ref 20–29)
Calcium: 9.7 mg/dL (ref 8.7–10.2)
Chloride: 102 mmol/L (ref 96–106)
Creatinine, Ser: 0.97 mg/dL (ref 0.57–1.00)
GFR calc Af Amer: 92 mL/min/{1.73_m2} (ref >59)
GFR calc non Af Amer: 80 mL/min/{1.73_m2} (ref >59)
Globulin, Total: 2.6 g/dL (ref 1.5–4.5)
Glucose: 92 mg/dL (ref 65–99)
Potassium: 4.3 mmol/L (ref 3.5–5.2)
Sodium: 139 mmol/L (ref 134–144)
Total Protein: 7 g/dL (ref 6.0–8.5)

## 2020-11-30 LAB — CBC
Hematocrit: 40.1 % (ref 34.0–46.6)
Hemoglobin: 13.1 g/dL (ref 11.1–15.9)
MCH: 29.5 pg (ref 26.6–33.0)
MCHC: 32.7 g/dL (ref 31.5–35.7)
MCV: 90 fL (ref 79–97)
Platelets: 220 10*3/uL (ref 150–450)
RBC: 4.44 x10E6/uL (ref 3.77–5.28)
RDW: 12.5 % (ref 11.7–15.4)
WBC: 4.7 10*3/uL (ref 3.4–10.8)

## 2020-11-30 LAB — VITAMIN D 25 HYDROXY (VIT D DEFICIENCY, FRACTURES): Vit D, 25-Hydroxy: 48.2 ng/mL (ref 30.0–100.0)

## 2020-11-30 LAB — HIV ANTIBODY (ROUTINE TESTING W REFLEX): HIV Screen 4th Generation wRfx: NONREACTIVE

## 2020-11-30 LAB — T PALLIDUM SCREENING CASCADE: T pallidum Antibodies (TP-PA): NONREACTIVE

## 2020-11-30 LAB — HEPATITIS C ANTIBODY: Hep C Virus Ab: <0.1 s/co ratio (ref 0.0–0.9)

## 2020-12-01 ENCOUNTER — Other Ambulatory Visit: Payer: Self-pay | Admitting: Nurse Practitioner

## 2020-12-01 DIAGNOSIS — N760 Acute vaginitis: Secondary | ICD-10-CM

## 2020-12-01 DIAGNOSIS — B9689 Other specified bacterial agents as the cause of diseases classified elsewhere: Secondary | ICD-10-CM

## 2020-12-01 LAB — CERVICOVAGINAL ANCILLARY ONLY
Bacterial Vaginitis (gardnerella): POSITIVE — AB
Chlamydia: NEGATIVE
Comment: NEGATIVE
Comment: NEGATIVE
Comment: NEGATIVE
Comment: NORMAL
Neisseria Gonorrhea: NEGATIVE
Trichomonas: NEGATIVE

## 2020-12-01 MED ORDER — METRONIDAZOLE 500 MG PO TABS: 500.0000 mg | ORAL_TABLET | Freq: Three times a day (TID) | ORAL | 0 refills | Status: AC

## 2020-12-03 LAB — CYTOLOGY - PAP
Comment: NEGATIVE
Diagnosis: NEGATIVE
HSV1: NEGATIVE
HSV2: NEGATIVE

## 2021-04-27 ENCOUNTER — Other Ambulatory Visit: Payer: Self-pay | Admitting: Nurse Practitioner

## 2021-04-27 DIAGNOSIS — H35033 Hypertensive retinopathy, bilateral: Secondary | ICD-10-CM

## 2021-05-03 ENCOUNTER — Ambulatory Visit
Admission: RE | Admit: 2021-05-03 | Discharge: 2021-05-03 | Disposition: A | Discharge status: Home / Self Care | Payer: Managed Care, Other (non HMO) | Source: Ambulatory Visit | Attending: Nurse Practitioner | Admitting: Nurse Practitioner

## 2021-05-03 ENCOUNTER — Other Ambulatory Visit: Payer: Self-pay

## 2021-05-03 DIAGNOSIS — H35033 Hypertensive retinopathy, bilateral: Secondary | ICD-10-CM

## 2021-05-09 ENCOUNTER — Encounter: Payer: Self-pay | Admitting: Nurse Practitioner

## 2021-05-09 LAB — HM DIABETES EYE EXAM

## 2021-05-11 ENCOUNTER — Encounter: Payer: Self-pay | Admitting: Nurse Practitioner

## 2021-05-21 ENCOUNTER — Telehealth: Payer: Managed Care, Other (non HMO) | Admitting: Nurse Practitioner

## 2021-05-21 DIAGNOSIS — H6690 Otitis media, unspecified, unspecified ear: Secondary | ICD-10-CM

## 2021-05-21 NOTE — Progress Notes (Signed)
[place] of state- unable to be seen at this time.

## 2021-05-25 ENCOUNTER — Other Ambulatory Visit: Payer: Self-pay

## 2021-05-25 ENCOUNTER — Ambulatory Visit (INDEPENDENT_AMBULATORY_CARE_PROVIDER_SITE_OTHER): Payer: Managed Care, Other (non HMO) | Admitting: Nurse Practitioner

## 2021-05-25 ENCOUNTER — Encounter: Payer: Self-pay | Admitting: Nurse Practitioner

## 2021-05-25 VITALS — BP 120/60 | HR 66 | Temp 98.5°F | Ht 62.2 in | Wt 180.4 lb

## 2021-05-25 DIAGNOSIS — R1111 Vomiting without nausea: Secondary | ICD-10-CM

## 2021-05-25 DIAGNOSIS — Z6832 Body mass index (BMI) 32.0-32.9, adult: Secondary | ICD-10-CM

## 2021-05-25 DIAGNOSIS — R590 Localized enlarged lymph nodes: Secondary | ICD-10-CM | POA: Diagnosis not present

## 2021-05-25 DIAGNOSIS — E6609 Other obesity due to excess calories: Secondary | ICD-10-CM | POA: Diagnosis not present

## 2021-05-25 LAB — POC COVID19 BINAXNOW: SARS Coronavirus 2 Ag: NEGATIVE

## 2021-05-25 MED ORDER — FEXOFENADINE HCL 180 MG PO TABS: 180.0000 mg | ORAL_TABLET | Freq: Every day | ORAL | 1 refills | Status: DC

## 2021-05-25 MED ORDER — ONDANSETRON HCL 4 MG PO TABS: 4.0000 mg | ORAL_TABLET | Freq: Every day | ORAL | 1 refills | Status: AC | PRN

## 2021-05-25 NOTE — Progress Notes (Signed)
I,Tianna Badgett,acting as a Education administrator for [employer], FNP.,have documented all relevant documentation on the behalf of Minette Brine, FNP,as directed by  Minette Brine, FNP while in the presence of Minette Brine, [place].  This visit occurred during the SARS-CoV-2 public health emergency.  Safety protocols were in place, including screening questions prior to the visit, additional usage of staff PPE, and extensive cleaning of exam room while observing appropriate contact time as indicated for disinfecting solutions.  Subjective:     Patient ID: Destiny Ware , female    DOB: 11-12-1990 , 30 y.o.   MRN: [id number]   Chief Complaint  Patient presents with   Emesis    HPI  She reports feel like something is stuck to the side of her throat. LMP - 05/18/2021 Last bowel movement was yesterday.  Every time she eat she has vomiting at least 30 minutes later.  Denies abdomen pain  She denies a chance of her being pregnant she is sexually active with women  Emesis  The current episode started in the past 7 days. The problem occurs 2 to 4 times per day. The problem has been unchanged. Emesis appearance: clear. There has been no fever. Pertinent negatives include no abdominal pain, arthralgias, chills, diarrhea, fever, headaches or URI. Treatments tried: tums, antihistamines. The treatment provided no relief.    History reviewed. No pertinent past medical history.   Family History  Problem Relation Age of Onset   Healthy Mother    Healthy Father    Healthy Sister    Healthy Sister    Healthy Brother    Healthy Brother      Current Outpatient Medications:    fexofenadine (ALLEGRA ALLERGY) 180 MG tablet, Take 1 tablet (180 mg total) by mouth daily., Disp: 90 tablet, Rfl: 1   ondansetron (ZOFRAN) 4 MG tablet, Take 1 tablet (4 mg total) by mouth daily as needed for nausea or vomiting., Disp: 30 tablet, Rfl: 1   albuterol (VENTOLIN HFA) 108 (90 Base) MCG/ACT inhaler, Inhale 2 puffs into the lungs every  6 (six) hours as needed for wheezing or shortness of breath., Disp: 8 g, Rfl: 2   No Known Allergies   Review of Systems  Constitutional:  Negative for chills and fever.  Respiratory: Negative.    Cardiovascular: Negative.   Gastrointestinal:  Positive for vomiting. Negative for abdominal pain and diarrhea.  Musculoskeletal:  Negative for arthralgias.  Neurological:  Negative for headaches.    Today's Vitals   05/25/21 0855  BP: 120/60  Pulse: 66  Temp: 98.5 F (36.9 C)  TempSrc: Oral  Weight: 180 lb 6.4 oz (81.8 kg)  Height: 5' 2.2" (1.58 m)   Body mass index is 32.78 kg/m.  Wt Readings from Last 3 Encounters:  05/25/21 180 lb 6.4 oz (81.8 kg)  11/29/20 176 lb 3.2 oz (79.9 kg)  06/23/20 169 lb (76.7 kg)    Objective:  Physical Exam Vitals reviewed.  Constitutional:      Appearance: Normal appearance.  Cardiovascular:     Rate and Rhythm: Normal rate and regular rhythm.     Pulses: Normal pulses.     Heart sounds: Normal heart sounds. No murmur heard. Pulmonary:     Effort: Pulmonary effort is normal. No respiratory distress.     Breath sounds: Normal breath sounds. No wheezing.  Abdominal:     General: Abdomen is flat. Bowel sounds are normal. There is no distension.     Palpations: Abdomen is soft.  Tenderness: There is abdominal tenderness (upper abdomen).  Neurological:     Mental Status: She is alert.        Assessment And Plan:     1. Vomiting without nausea, intractability of vomiting not specified, unspecified vomiting type Comments: Will send for Abdomen ultrasound Mild tenderness upper abdomen Rapid covid is negative - POC COVID-19 - Novel Coronavirus, NAA ([facility]) - ondansetron (ZOFRAN) 4 MG tablet; Take 1 tablet (4 mg total) by mouth daily as needed for nausea or vomiting.  Dispense: 30 tablet; Refill: 1 - CBC - CMP14+EGFR - Lipase - Amylase - US Abdomen Limited RUQ (LIVER/GB); Future  2. Class 1 obesity due to excess calories without  serious comorbidity with body mass index (BMI) of 32.0 to 32.9 in adult  She is encouraged to strive for BMI less than 30 to decrease cardiac risk. Advised to aim for at least 150 minutes of exercise per week.   Patient was given opportunity to ask questions. Patient verbalized understanding of the plan and was able to repeat key elements of the plan. All questions were answered to their satisfaction.  Minette Brine, FNP   I, Minette Brine, FNP, have reviewed all documentation for this visit. The documentation on 05/25/21 for the exam, diagnosis, procedures, and orders are all accurate and complete.   IF YOU HAVE BEEN REFERRED TO A SPECIALIST, IT MAY TAKE 1-2 WEEKS TO SCHEDULE/PROCESS THE REFERRAL. IF YOU HAVE NOT HEARD FROM US/SPECIALIST IN TWO WEEKS, PLEASE GIVE [place] A CALL AT [phone].   THE PATIENT IS ENCOURAGED TO PRACTICE SOCIAL DISTANCING DUE TO THE COVID-19 PANDEMIC.

## 2021-05-26 LAB — CBC
Hematocrit: 38.3 % (ref 34.0–46.6)
Hemoglobin: 12.9 g/dL (ref 11.1–15.9)
MCH: 29.9 pg (ref 26.6–33.0)
MCHC: 33.7 g/dL (ref 31.5–35.7)
MCV: 89 fL (ref 79–97)
Platelets: 249 10*3/uL (ref 150–450)
RBC: 4.31 x10E6/uL (ref 3.77–5.28)
RDW: 12.4 % (ref 11.7–15.4)
WBC: 5.6 10*3/uL (ref 3.4–10.8)

## 2021-05-26 LAB — CMP14+EGFR
ALT: 8 IU/L (ref 0–32)
AST: 16 IU/L (ref 0–40)
Albumin/Globulin Ratio: 1.7 (ref 1.2–2.2)
Albumin: 4.3 g/dL (ref 3.9–5.0)
Alkaline Phosphatase: 83 IU/L (ref 44–121)
BUN/Creatinine Ratio: 9 (ref 9–23)
BUN: 8 mg/dL (ref 6–20)
Bilirubin Total: 0.4 mg/dL (ref 0.0–1.2)
CO2: 24 mmol/L (ref 20–29)
Calcium: 9.5 mg/dL (ref 8.7–10.2)
Chloride: 105 mmol/L (ref 96–106)
Creatinine, Ser: 0.94 mg/dL (ref 0.57–1.00)
Globulin, Total: 2.5 g/dL (ref 1.5–4.5)
Glucose: 93 mg/dL (ref 65–99)
Potassium: 4.2 mmol/L (ref 3.5–5.2)
Sodium: 143 mmol/L (ref 134–144)
Total Protein: 6.8 g/dL (ref 6.0–8.5)
eGFR: 84 mL/min/{1.73_m2} (ref >59)

## 2021-05-26 LAB — SARS-COV-2, NAA 2 DAY TAT

## 2021-05-26 LAB — LIPASE: Lipase: 16 U/L (ref 14–72)

## 2021-05-26 LAB — AMYLASE: Amylase: 61 U/L (ref 31–110)

## 2021-05-26 LAB — NOVEL CORONAVIRUS, NAA: SARS-CoV-2, NAA: NOT DETECTED

## 2021-05-30 ENCOUNTER — Ambulatory Visit (HOSPITAL_COMMUNITY)
Admission: EM | Admit: 2021-05-30 | Discharge: 2021-05-30 | Disposition: A | Discharge status: Home / Self Care | Payer: Managed Care, Other (non HMO) | Attending: Emergency Medicine | Admitting: Emergency Medicine

## 2021-05-30 ENCOUNTER — Ambulatory Visit: Admission: EM | Admit: 2021-05-30 | Payer: Managed Care, Other (non HMO)

## 2021-05-30 ENCOUNTER — Encounter (HOSPITAL_COMMUNITY): Payer: Self-pay

## 2021-05-30 ENCOUNTER — Other Ambulatory Visit: Payer: Self-pay

## 2021-05-30 DIAGNOSIS — J0100 Acute maxillary sinusitis, unspecified: Secondary | ICD-10-CM

## 2021-05-30 MED ORDER — AMOXICILLIN-POT CLAVULANATE 875-125 MG PO TABS: 1.0000 | ORAL_TABLET | Freq: Two times a day (BID) | ORAL | 0 refills | Status: DC

## 2021-05-30 NOTE — ED Provider Notes (Addendum)
[facility]   [id number] 05/30/21 Arrival Time: 1503  XT:KWIO THROAT  SUBJECTIVE: History from: patient.  Destiny Ware is a 30 y.o. female who presents with abrupt onset of nasal congestion, headache, fatigue for 2 days .  Has had some congestion ongoing for the past several days.   Reports seen at PCP last week and had a negative COVID test.  Has tried OTC medications without relief.  There are no aggravating symptoms. Denies previous symptoms in the past.     Denies fever, chills, ear pain, cough, SOB, wheezing, chest pain, nausea, rash, changes in bowel or bladder habits.     ROS: As per HPI.  All other pertinent ROS negative.     History reviewed. No pertinent past medical history. History reviewed. No pertinent surgical history. No Known Allergies No current facility-administered medications on file prior to encounter.   Current Outpatient Medications on File Prior to Encounter  Medication Sig Dispense Refill   albuterol (VENTOLIN HFA) 108 (90 Base) MCG/ACT inhaler Inhale 2 puffs into the lungs every 6 (six) hours as needed for wheezing or shortness of breath. 8 g 2   fexofenadine (ALLEGRA ALLERGY) 180 MG tablet Take 1 tablet (180 mg total) by mouth daily. 90 tablet 1   ondansetron (ZOFRAN) 4 MG tablet Take 1 tablet (4 mg total) by mouth daily as needed for nausea or vomiting. 30 tablet 1   Social History   Socioeconomic History   Marital status: Single    Spouse name: Not on file   Number of children: Not on file   Years of education: Not on file   Highest education level: Not on file  Occupational History   Not on file  Tobacco Use   Smoking status: Never   Smokeless tobacco: Never  Substance and Sexual Activity   Alcohol use: Yes    Comment: occasionally    Drug use: Never   Sexual activity: Not on file  Other Topics Concern   Not on file  Social History Narrative   Not on file   Social Determinants of Health   Financial Resource Strain: Not on  file  Food Insecurity: Not on file  Transportation Needs: Not on file  Physical Activity: Not on file  Stress: Not on file  Social Connections: Not on file  Intimate Partner Violence: Not on file   Family History  Problem Relation Age of Onset   Healthy Mother    Healthy Father    Healthy Sister    Healthy Sister    Healthy Brother    Healthy Brother     OBJECTIVE:  Vitals:   05/30/21 1548  BP: 117/64  Pulse: 91  Resp: 17  Temp: 98.1 F (36.7 C)  TempSrc: Oral  SpO2: 97%     General appearance: alert; appears fatigued, but nontoxic, speaking in full sentences and managing own secretions HEENT: NCAT; Ears: EACs clear, TMs pearly gray with visible cone of light, without erythema; Eyes: PERRL, EOMI grossly; Nose: no obvious rhinorrhea; Throat: oropharynx clear, tonsils 1+ and mildly erythematous without white tonsillar exudates, uvula midline; Sinuses: maxillary tender to palpation Neck: supple without LAD Lungs: CTA bilaterally without adventitious breath sounds; cough absent Heart: regular rate and rhythm.  Radial pulses 2+ symmetrical bilaterally Skin: warm and dry Psychological: alert and cooperative; normal mood and affect  LABS: No results found for this or any previous visit (from the past 24 hour(s)).   ASSESSMENT & PLAN:  1. Acute non-recurrent maxillary sinusitis  Meds ordered this encounter  Medications   amoxicillin-clavulanate (AUGMENTIN) 875-125 MG tablet    Sig: Take 1 tablet by mouth every 12 (twelve) hours.    Dispense:  14 tablet    Refill:  0    Order Specific Question:   Supervising Provider    Answer:   Merrilee Jansky [id number]    Acute Sinusitis Push fluids and get rest Prescribed amoxicillin 875mg  twice daily for 7 days.   Take as directed and to completion.  Drink warm or cool liquids, use throat lozenges, or popsicles to help alleviate symptoms Take OTC ibuprofen or tylenol as needed for pain May use Zyrtec D and flonase to  help alleviate symptoms Follow up with PCP if symptoms persist Return or go to ER if you have any new or worsening symptoms such as fever, chills, nausea, vomiting, worsening sore throat, cough, abdominal pain, chest pain, changes in bowel or bladder habits.   Reviewed expectations re: course of current medical issues. Questions answered. Outlined signs and symptoms indicating need for more acute intervention. Patient verbalized understanding. After Visit Summary given.           , NP 05/30/21 1651    06/01/21, NP 05/30/21 [phone]

## 2021-05-30 NOTE — Discharge Instructions (Addendum)
Take the Augmentin twice a day for the next 7 days.    You can take Tylenol and/or Ibuprofen as needed for pain relief and fever reduction.   You can use decongestants and antihistamines to help with symptom relief and congestion, such as sudafed, mucinex, or zyrtec.  Use Flonase nasal spray 1 spray in each nostril daily to help with congestion.    Return or go to the Emergency Department if symptoms worsen or do not improve in the next few days.

## 2021-05-30 NOTE — ED Triage Notes (Signed)
Pt presents with nasal drainage and congestion X 2 days.

## 2021-06-02 ENCOUNTER — Ambulatory Visit
Admission: RE | Admit: 2021-06-02 | Discharge: 2021-06-02 | Disposition: A | Discharge status: Home / Self Care | Payer: Managed Care, Other (non HMO) | Source: Ambulatory Visit | Attending: Nurse Practitioner | Admitting: Nurse Practitioner

## 2021-06-02 ENCOUNTER — Other Ambulatory Visit: Payer: Self-pay

## 2021-06-02 DIAGNOSIS — R1111 Vomiting without nausea: Secondary | ICD-10-CM

## 2021-06-23 ENCOUNTER — Other Ambulatory Visit: Payer: Managed Care, Other (non HMO)

## 2021-08-19 ENCOUNTER — Ambulatory Visit
Admission: EM | Admit: 2021-08-19 | Discharge: 2021-08-19 | Disposition: A | Discharge status: Home / Self Care | Payer: Self-pay | Attending: Emergency Medicine | Admitting: Emergency Medicine

## 2021-08-19 ENCOUNTER — Other Ambulatory Visit: Payer: Self-pay

## 2021-08-19 DIAGNOSIS — B349 Viral infection, unspecified: Secondary | ICD-10-CM

## 2021-08-19 NOTE — Discharge Instructions (Addendum)
Your symptoms are most consistent with a viral upper respiratory illness.  Conservative care is recommended at this time.  This includes rest, pushing clear fluids and activity as tolerated.  You may also noticed that your appetite is reduced, this is okay as long as you are drinking plenty of clear fluids.  Acetaminophen (Tylenol): This is a good fever reducer.  If your body temperature rises above 101.5 as measured with a thermometer, it is recommended that you take 1,000 mg every 6-8 hours until your temperature falls below 101.5, please not take more than 3,000 mg of acetaminophen either as a separate medication or as in ingredient in an over-the-counter cold/flu preparation within a 24-hour period  Ibuprofen  (Advil, Motrin): This is a good anti-inflammatory medication which addresses aches and pains and, to some degree, congestion in the nasal passages.  I recommend taking between 400 to 600 mg every 6-8 hours as needed.  Pseudoephedrine (Sudafed): This is a decongestant.  This medication has to be purchased from the pharmacist counter, I recommend taking 2 tablets, 60 mg, 2-3 times a day as needed to relieve runny nose and sinus drainage.  Guaifenesin (Robitussin, Mucinex): This is an expectorant.  This helps break up chest congestion and loosen up thick nasal drainage making phlegm and drainage more liquid and therefore easier to remove.  I recommend taking 400 mg three times daily as needed.  Dextromethorphan (any cough medicine with the letters "DM" added to it's name such as Robitussin DM): This is a cough suppressant.  This is often recommended to be taken at nighttime to suppress cough and help people sleep.  Take as directed on the bottle.   Based on my physical exam findings and the history you provided  today, I do not see any evidence of bacterial infection therefore treatment with antibiotics would be of no benefit.  The results of your viral testing will be made available to you by  phone within the next 24 to 48 hours, they will also be posted to your MyChart account.  If any of your results are positive, further recommendations will be provided.  You were tested today for:  COVID Influenza

## 2021-08-19 NOTE — ED Triage Notes (Signed)
Pt reports having chest tightness (pt reports having asthma), sore throat and right ear pain.  Pt does not display s/s of respiratory distress at this time.  Started: Wednesday

## 2021-08-19 NOTE — ED Provider Notes (Addendum)
[facility]    CSN: [id number] Arrival date & time: 08/19/21  1038      History   Chief Complaint No chief complaint on file.   HPI Destiny Ware is a 30 y.o. female.   Patient reports attending a homecoming festivities at her school, states she was exposed to multiple people with known influenza and is not sure she is been exposed to COVID, patient is requesting testing for both today.  Patient reports a history of chest tightness due to asthma, states her throat is sore and she is having some right ear pain as well.  Patient is displaying any signs or symptoms of respiratory distress at this time, vital signs are stable on arrival today.  Patient states she began to feel unwell 2 days ago.  Denies fever, nausea, vomiting, diarrhea, headache, loss of taste or smell, body ache.  The history is provided by the patient.   History reviewed. No pertinent past medical history.  Patient Active Problem List   Diagnosis Date Noted   Vitamin D deficiency 06/23/2020    History reviewed. No pertinent surgical history.  OB History   No obstetric history on file.      Home Medications    Prior to Admission medications   Medication Sig Start Date End Date Taking? Authorizing Provider  albuterol (VENTOLIN HFA) 108 (90 Base) MCG/ACT inhaler Inhale 2 puffs into the lungs every 6 (six) hours as needed for wheezing or shortness of breath. 06/23/20   Arnette Felts, FNP  ondansetron (ZOFRAN) 4 MG tablet Take 1 tablet (4 mg total) by mouth daily as needed for nausea or vomiting. 05/25/21 05/25/22  Arnette Felts, FNP    Family History Family History  Problem Relation Age of Onset   Healthy Mother    Healthy Father    Healthy Sister    Healthy Sister    Healthy Brother    Healthy Brother     Social History Social History   Tobacco Use   Smoking status: Never   Smokeless tobacco: Never  Substance Use Topics   Alcohol use: Yes    Comment: occasionally    Drug use: Never      Allergies   Patient has no known allergies.   Review of Systems Review of Systems Pertinent findings noted in history of present illness.    Physical Exam Triage Vital Signs ED Triage Vitals  Enc Vitals Group     BP 08/12/21 0827 (!) 147/82     Pulse Rate 08/12/21 0827 72     Resp 08/12/21 0827 18     Temp 08/12/21 0827 98.3 F (36.8 C)     Temp Source 08/12/21 0827 Oral     SpO2 08/12/21 0827 98 %     Weight --      Height --      Head Circumference --      Peak Flow --      Pain Score 08/12/21 0826 5     Pain Loc --      Pain Edu? --      Excl. in GC? --    No data found.  Updated Vital Signs BP 124/80 (BP Location: Right Arm)   Pulse 75   Temp 98.4 F (36.9 C) (Oral)   Resp 16   LMP 08/03/2021 (Approximate)   SpO2 97%   Visual Acuity Right Eye Distance:   Left Eye Distance:   Bilateral Distance:    Right Eye Near:   Left  Eye Near:    Bilateral Near:     Physical Exam Vitals and nursing note reviewed.  Constitutional:      General: She is not in acute distress.    Appearance: Normal appearance. She is not ill-appearing.  HENT:     Head: Normocephalic and atraumatic.     Salivary Glands: Right salivary gland is not diffusely enlarged or tender. Left salivary gland is not diffusely enlarged or tender.     Right Ear: Tympanic membrane, ear canal and external ear normal. No drainage. No middle ear effusion. There is no impacted cerumen. Tympanic membrane is not erythematous or bulging.     Left Ear: Tympanic membrane, ear canal and external ear normal. No drainage.  No middle ear effusion. There is no impacted cerumen. Tympanic membrane is not erythematous or bulging.     Nose: Nose normal. No nasal deformity, septal deviation, mucosal edema, congestion or rhinorrhea.     Right Turbinates: Not enlarged, swollen or pale.     Left Turbinates: Not enlarged, swollen or pale.     Right Sinus: No maxillary sinus tenderness or frontal sinus tenderness.      Left Sinus: No maxillary sinus tenderness or frontal sinus tenderness.     Mouth/Throat:     Lips: Pink. No lesions.     Mouth: Mucous membranes are moist. No oral lesions.     Pharynx: Oropharynx is clear. Uvula midline. No posterior oropharyngeal erythema or uvula swelling.     Tonsils: No tonsillar exudate. 0 on the right. 0 on the left.  Eyes:     General: Lids are normal.        Right eye: No discharge.        Left eye: No discharge.     Extraocular Movements: Extraocular movements intact.     Conjunctiva/sclera: Conjunctivae normal.     Right eye: Right conjunctiva is not injected.     Left eye: Left conjunctiva is not injected.  Neck:     Trachea: Trachea and phonation normal.  Cardiovascular:     Rate and Rhythm: Normal rate and regular rhythm.     Pulses: Normal pulses.     Heart sounds: Normal heart sounds. No murmur heard.   No friction rub. No gallop.  Pulmonary:     Effort: Pulmonary effort is normal. No accessory muscle usage, prolonged expiration or respiratory distress.     Breath sounds: Normal breath sounds. No stridor, decreased air movement or transmitted upper airway sounds. No decreased breath sounds, wheezing, rhonchi or rales.  Chest:     Chest wall: No tenderness.  Musculoskeletal:        General: Normal range of motion.     Cervical back: Normal range of motion and neck supple. Normal range of motion.  Lymphadenopathy:     Cervical: No cervical adenopathy.  Skin:    General: Skin is warm and dry.     Findings: No erythema or rash.  Neurological:     General: No focal deficit present.     Mental Status: She is alert and oriented to person, place, and time.  Psychiatric:        Mood and Affect: Mood normal.        Behavior: Behavior normal.     UC Treatments / Results  Labs (all labs ordered are listed, but only abnormal results are displayed) Labs Reviewed  COVID-19, FLU A+B NAA    EKG   Radiology No results  found.  Procedures Procedures (including  critical care time)  Medications Ordered in UC Medications - No data to display  Initial Impression / Assessment and Plan / UC Course  I have reviewed the triage vital signs and the nursing notes.  Pertinent labs & imaging results that were available during my care of the patient were reviewed by me and considered in my medical decision making (see chart for details).     Physical exam today is unremarkable, patient is nontoxic in appearance and vital signs are normal.  COVID and influenza testing performed as requested, patient advised she will be notified of the results once received.  Conservative care recommended.  Patient verbalized understanding and agreement of plan as discussed.  All questions were addressed during visit.  Please see discharge instructions below for further details of plan.  Final Clinical Impressions(s) / UC Diagnoses   Final diagnoses:  Viral illness     Discharge Instructions      Your symptoms are most consistent with a viral upper respiratory illness.  Conservative care is recommended at this time.  This includes rest, pushing clear fluids and activity as tolerated.  You may also noticed that your appetite is reduced, this is okay as long as you are drinking plenty of clear fluids.  Acetaminophen (Tylenol): This is a good fever reducer.  If your body temperature rises above 101.5 as measured with a thermometer, it is recommended that you take 1,000 mg every 6-8 hours until your temperature falls below 101.5, please not take more than 3,000 mg of acetaminophen either as a separate medication or as in ingredient in an over-the-counter cold/flu preparation within a 24-hour period  Ibuprofen  (Advil, Motrin): This is a good anti-inflammatory medication which addresses aches and pains and, to some degree, congestion in the nasal passages.  I recommend taking between 400 to 600 mg every 6-8 hours as  needed.  Pseudoephedrine (Sudafed): This is a decongestant.  This medication has to be purchased from the pharmacist counter, I recommend taking 2 tablets, 60 mg, 2-3 times a day as needed to relieve runny nose and sinus drainage.  Guaifenesin (Robitussin, Mucinex): This is an expectorant.  This helps break up chest congestion and loosen up thick nasal drainage making phlegm and drainage more liquid and therefore easier to remove.  I recommend taking 400 mg three times daily as needed.  Dextromethorphan (any cough medicine with the letters "DM" added to it's name such as Robitussin DM): This is a cough suppressant.  This is often recommended to be taken at nighttime to suppress cough and help people sleep.  Take as directed on the bottle.   Based on my physical exam findings and the history you provided  today, I do not see any evidence of bacterial infection therefore treatment with antibiotics would be of no benefit.  The results of your viral testing will be made available to you by phone within the next 24 to 48 hours, they will also be posted to your MyChart account.  If any of your results are positive, further recommendations will be provided.  You were tested today for:  COVID Influenza     ED Prescriptions   None    PDMP not reviewed this encounter.    Theadora Rama Scales, PA-C 08/19/21 1129    Theadora Rama Scales, PA-C 08/19/21 1150

## 2021-08-20 LAB — COVID-19, FLU A+B NAA
Influenza A, NAA: NOT DETECTED
Influenza B, NAA: NOT DETECTED
SARS-CoV-2, NAA: NOT DETECTED

## 2021-11-30 ENCOUNTER — Encounter: Payer: Managed Care, Other (non HMO) | Admitting: Nurse Practitioner

## 2022-01-13 ENCOUNTER — Ambulatory Visit
Admission: RE | Admit: 2022-01-13 | Discharge: 2022-01-13 | Disposition: A | Discharge status: Home / Self Care | Payer: Self-pay | Source: Ambulatory Visit | Attending: Emergency Medicine | Admitting: Emergency Medicine

## 2022-01-13 ENCOUNTER — Telehealth: Payer: Self-pay | Admitting: Physician Assistant

## 2022-01-13 ENCOUNTER — Other Ambulatory Visit: Payer: Self-pay

## 2022-01-13 VITALS — BP 120/66 | HR 62 | Temp 98.0°F | Resp 18

## 2022-01-13 DIAGNOSIS — B9789 Other viral agents as the cause of diseases classified elsewhere: Secondary | ICD-10-CM

## 2022-01-13 DIAGNOSIS — J019 Acute sinusitis, unspecified: Secondary | ICD-10-CM

## 2022-01-13 DIAGNOSIS — R590 Localized enlarged lymph nodes: Secondary | ICD-10-CM | POA: Insufficient documentation

## 2022-01-13 DIAGNOSIS — J029 Acute pharyngitis, unspecified: Secondary | ICD-10-CM

## 2022-01-13 HISTORY — DX: Unspecified asthma, uncomplicated: J45.909

## 2022-01-13 MED ORDER — AMOXICILLIN-POT CLAVULANATE 875-125 MG PO TABS: 1.0000 | ORAL_TABLET | Freq: Two times a day (BID) | ORAL | 0 refills | Status: AC

## 2022-01-13 MED ORDER — BENZONATATE 100 MG PO CAPS: 100.0000 mg | ORAL_CAPSULE | Freq: Three times a day (TID) | ORAL | 0 refills | Status: AC | PRN

## 2022-01-13 MED ORDER — IPRATROPIUM BROMIDE 0.03 % NA SOLN: 2.0000 | Freq: Two times a day (BID) | NASAL | 0 refills | Status: AC

## 2022-01-13 NOTE — ED Provider Notes (Signed)
?[facility] ? ? ? ?CSN: [id number] ?Arrival date & time: 01/13/22  1308 ?  ? ?HISTORY  ? ?Chief Complaint  ?Patient presents with  ? Mass  ? ?HPI ?Destiny Ware is a 31 y.o. female. Patient presents to [facility] for evaluation of right neck swelling (poss lymph node?) with pain with movement and touch.  States when she looks up she feels like there is drainage trickling down the back of her throat.  Patient states she is also noticed that when she swallows, there is a foreign body sensation making it difficult to swallow large boluses of food.  Patient denies difficulty managing secretions, swallowing liquids.  Patient states this particular lymph node has been enlarged before.  Patient states she had a CT scan of her neck and was told there were no abnormal findings.  Patient has normal vital signs on arrival.  Patient denies sick contacts. ? ?The history is provided by the patient.  ?Past Medical History:  ?Diagnosis Date  ? Asthma   ? ?Patient Active Problem List  ? Diagnosis Date Noted  ? Vitamin D deficiency 06/23/2020  ? ?Past Surgical History:  ?Procedure Laterality Date  ? ADENOIDECTOMY    ? TONSILLECTOMY    ? ?OB History   ?No obstetric history on file. ?  ? ?Home Medications   ? ?Prior to Admission medications   ?Medication Sig Start Date End Date Taking? Authorizing Provider  ?albuterol (VENTOLIN HFA) 108 (90 Base) MCG/ACT inhaler Inhale 2 puffs into the lungs every 6 (six) hours as needed for wheezing or shortness of breath. 06/23/20   Arnette Felts, FNP  ?benzonatate (TESSALON) 100 MG capsule Take 1 capsule (100 mg total) by mouth 3 (three) times daily as needed. 01/13/22   Margaretann Loveless, PA-C  ?ipratropium (ATROVENT) 0.03 % nasal spray Place 2 sprays into both nostrils every 12 (twelve) hours. 01/13/22   Margaretann Loveless, PA-C  ?ondansetron (ZOFRAN) 4 MG tablet Take 1 tablet (4 mg total) by mouth daily as needed for nausea or vomiting. 05/25/21 05/25/22  Arnette Felts, FNP  ? ?Family  History ?Family History  ?Problem Relation Age of Onset  ? Healthy Mother   ? Healthy Father   ? Healthy Sister   ? Healthy Sister   ? Healthy Brother   ? Healthy Brother   ? ?Social History ?Social History  ? ?Tobacco Use  ? Smoking status: Never  ? Smokeless tobacco: Never  ?Substance Use Topics  ? Alcohol use: Yes  ?  Comment: occasionally   ? Drug use: Never  ? ?Allergies   ?Patient has no known allergies. ? ?Review of Systems ?Review of Systems ?Pertinent findings noted in history of present illness.  ? ?Physical Exam ?Triage Vital Signs ?ED Triage Vitals  ?Enc Vitals Group  ?   BP 08/12/21 0827 (!) 147/82  ?   Pulse Rate 08/12/21 0827 72  ?   Resp 08/12/21 0827 18  ?   Temp 08/12/21 0827 98.3 ?F (36.8 ?C)  ?   Temp Source 08/12/21 0827 Oral  ?   SpO2 08/12/21 0827 98 %  ?   Weight --   ?   Height --   ?   Head Circumference --   ?   Peak Flow --   ?   Pain Score 08/12/21 0826 5  ?   Pain Loc --   ?   Pain Edu? --   ?   Excl. in GC? --   ?No data  found. ? ?Updated Vital Signs ?BP 120/66 (BP Location: Right Arm)   Pulse 62   Temp 98 ?F (36.7 ?C) (Oral)   Resp 18   LMP 01/07/2022   SpO2 97%  ? ?Physical Exam ?Vitals and nursing note reviewed.  ?Constitutional:   ?   General: She is not in acute distress. ?   Appearance: Normal appearance. She is not ill-appearing.  ?HENT:  ?   Head: Normocephalic and atraumatic.  ?   Salivary Glands: Right salivary gland is not diffusely enlarged or tender. Left salivary gland is not diffusely enlarged or tender.  ?   Right Ear: Tympanic membrane, ear canal and external ear normal. No drainage. No middle ear effusion. There is no impacted cerumen. Tympanic membrane is not erythematous or bulging.  ?   Left Ear: Tympanic membrane, ear canal and external ear normal. No drainage.  No middle ear effusion. There is no impacted cerumen. Tympanic membrane is not erythematous or bulging.  ?   Nose: Nose normal. No nasal deformity, septal deviation, mucosal edema, congestion or  rhinorrhea.  ?   Right Turbinates: Not enlarged, swollen or pale.  ?   Left Turbinates: Not enlarged, swollen or pale.  ?   Right Sinus: No maxillary sinus tenderness or frontal sinus tenderness.  ?   Left Sinus: No maxillary sinus tenderness or frontal sinus tenderness.  ?   Mouth/Throat:  ?   Lips: Pink. No lesions.  ?   Mouth: Mucous membranes are moist. No oral lesions.  ?   Pharynx: Oropharynx is clear. Uvula midline. No posterior oropharyngeal erythema or uvula swelling.  ?   Tonsils: No tonsillar exudate. 0 on the right. 0 on the left.  ?Eyes:  ?   General: Lids are normal.     ?   Right eye: No discharge.     ?   Left eye: No discharge.  ?   Extraocular Movements: Extraocular movements intact.  ?   Conjunctiva/sclera: Conjunctivae normal.  ?   Right eye: Right conjunctiva is not injected.  ?   Left eye: Left conjunctiva is not injected.  ?Neck:  ?   Trachea: Trachea and phonation normal.  ?Cardiovascular:  ?   Rate and Rhythm: Normal rate and regular rhythm.  ?   Pulses: Normal pulses.  ?   Heart sounds: Normal heart sounds. No murmur heard. ?  No friction rub. No gallop.  ?Pulmonary:  ?   Effort: Pulmonary effort is normal. No accessory muscle usage, prolonged expiration or respiratory distress.  ?   Breath sounds: Normal breath sounds. No stridor, decreased air movement or transmitted upper airway sounds. No decreased breath sounds, wheezing, rhonchi or rales.  ?Chest:  ?   Chest wall: No tenderness.  ?Musculoskeletal:     ?   General: Normal range of motion.  ?   Cervical back: Full passive range of motion without pain, normal range of motion and neck supple. Normal range of motion.  ?Lymphadenopathy:  ?   Cervical: Cervical adenopathy present.  ?   Right cervical: Superficial cervical adenopathy (Single enlarged lymph node greater than 1 cm in diameter, tender to palpation without surrounding erythema or induration) present. No deep or posterior cervical adenopathy. ?   Left cervical: No superficial or  deep cervical adenopathy.  ?Skin: ?   General: Skin is warm and dry.  ?   Findings: No erythema or rash.  ?Neurological:  ?   General: No focal deficit present.  ?  Mental Status: She is alert and oriented to person, place, and time.  ?Psychiatric:     ?   Mood and Affect: Mood normal.     ?   Behavior: Behavior normal.  ? ? ?Visual Acuity ?Right Eye Distance:   ?Left Eye Distance:   ?Bilateral Distance:   ? ?Right Eye Near:   ?Left Eye Near:    ?Bilateral Near:    ? ?UC Couse / Diagnostics / Procedures:  ?  ?EKG ? ?Radiology ?No results found. ? ?Procedures ?Procedures (including critical care time) ? ?UC Diagnoses / Final Clinical Impressions(s)   ?I have reviewed the triage vital signs and the nursing notes. ? ?Pertinent labs & imaging results that were available during my care of the patient were reviewed by me and considered in my medical decision making (see chart for details).   ?Final diagnoses:  ?Focal lymphadenopathy  ? ?Rapid strep test today is negative, will perform throat culture per protocol.  Given patient's report of drainage when she lifts her head to look up, I suspect that there is a purulent abscess in the back of her throat that is causing significant reactivity in her lymph node.  For completeness, we performed blood work for a CBC, sed rate, Westergren sed rate, metabolic panel and HIV test.  Patient advised that we will notify her of the results once received.  Patient further advised that I recommend that she begin antibiotics for most likely bacterial abscess and right side of her throat and that if she does not see improvement of the swelling and discomfort after 3 to 4 days, she should go to the emergency room for imaging or reach out to her primary care provider for the same. ?ED Prescriptions   ? ? Medication Sig Dispense Auth. Provider  ? amoxicillin-clavulanate (AUGMENTIN) 875-125 MG tablet Take 1 tablet by mouth every 12 (twelve) hours for 10 days. 20 tablet Theadora Rama Scales,  PA-C  ? ?  ? ?PDMP not reviewed this encounter. ? ?Pending results:  ?Labs Reviewed  ?CULTURE, GROUP A STREP [facility])  ?CBC WITH DIFFERENTIAL/PLATELET  ?HIV ANTIBODY (ROUTINE TESTING W REFLEX)  ?SEDIMENTATION RATE  ?COMP

## 2022-01-13 NOTE — Progress Notes (Signed)

## 2022-01-13 NOTE — ED Notes (Signed)
Unable to draw blood, unsucessfulx2 ?

## 2022-01-13 NOTE — ED Notes (Signed)
Pt states she will go to [facility] to have blood drawn. ?

## 2022-01-13 NOTE — ED Triage Notes (Signed)
PAtient presents to [facility] for evaluation of right neck swelling (poss lymph node?) with pain with movement and touch.  States when she looks up she feels drainage, and has some tightening of her throat.   ?

## 2022-01-13 NOTE — Discharge Instructions (Addendum)
Based on the history that you provided to me today and what I can see on physical exam, I believe that you may have a purulent abscess in the back of your throat and that antibiotics would be of benefit.  I sent a prescription for Augmentin to your pharmacy, please take 1 tablet twice daily for the next 10 days.  Keep in mind, you should have relief of your symptoms after 3 to 4 days.  If you do not, please either reach out to your primary care provider or go to the emergency room for further evaluation.  I believe that imaging would be indicated either by ultrasound or CT scan. ? ?To be complete, we screened you for several things including abnormal white blood cell counts, abnormal levels of inflammation in your blood, and screened you for HIV and Lyme disease.  Because you appear to be very well on exam today, I anticipate that all these results will be normal.  We will contact you once they are complete to let you know. ? ?Thank you for visiting urgent care today.  I appreciate the opportunity to participate in your care. ?

## 2022-01-18 LAB — CULTURE, GROUP A STREP (THRC)

## 2022-10-19 ENCOUNTER — Other Ambulatory Visit: Payer: Self-pay

## 2022-10-19 ENCOUNTER — Encounter (HOSPITAL_COMMUNITY): Payer: Self-pay

## 2022-10-19 ENCOUNTER — Emergency Department (HOSPITAL_COMMUNITY)
Admission: EM | Admit: 2022-10-19 | Discharge: 2022-10-19 | Disposition: A | Discharge status: Home / Self Care | Payer: Self-pay | Attending: Emergency Medicine | Admitting: Emergency Medicine

## 2022-10-19 ENCOUNTER — Emergency Department (HOSPITAL_COMMUNITY): Payer: Self-pay

## 2022-10-19 DIAGNOSIS — R0602 Shortness of breath: Secondary | ICD-10-CM | POA: Insufficient documentation

## 2022-10-19 DIAGNOSIS — R0789 Other chest pain: Secondary | ICD-10-CM | POA: Insufficient documentation

## 2022-10-19 LAB — I-STAT BETA HCG BLOOD, ED (MC, WL, AP ONLY): I-stat hCG, quantitative: <5 m[IU]/mL (ref <5)

## 2022-10-19 LAB — CBC
HCT: 39.5 % (ref 36.0–46.0)
Hemoglobin: 13.4 g/dL (ref 12.0–15.0)
MCH: 30.4 pg (ref 26.0–34.0)
MCHC: 33.9 g/dL (ref 30.0–36.0)
MCV: 89.6 fL (ref 80.0–100.0)
Platelets: 217 10*3/uL (ref 150–400)
RBC: 4.41 MIL/uL (ref 3.87–5.11)
RDW: 12.9 % (ref 11.5–15.5)
WBC: 4.1 10*3/uL (ref 4.0–10.5)
nRBC: 0 % (ref 0.0–0.2)

## 2022-10-19 LAB — BASIC METABOLIC PANEL
Anion gap: 10 (ref 5–15)
BUN: 9 mg/dL (ref 6–20)
CO2: 24 mmol/L (ref 22–32)
Calcium: 9.5 mg/dL (ref 8.9–10.3)
Chloride: 101 mmol/L (ref 98–111)
Creatinine, Ser: 0.89 mg/dL (ref 0.44–1.00)
GFR, Estimated: >60 mL/min (ref >60)
Glucose, Bld: 99 mg/dL (ref 70–99)
Potassium: 4 mmol/L (ref 3.5–5.1)
Sodium: 135 mmol/L (ref 135–145)

## 2022-10-19 LAB — TROPONIN I (HIGH SENSITIVITY): Troponin I (High Sensitivity): <2 ng/L (ref <18)

## 2022-10-19 MED ORDER — IBUPROFEN 200 MG PO TABS: 400.0000 mg | ORAL_TABLET | Freq: Once | ORAL | Status: DC

## 2022-10-19 MED ORDER — ACETAMINOPHEN 325 MG PO TABS
650.0000 mg | ORAL_TABLET | Freq: Once | ORAL | Status: DC
Start: 2022-10-19 — End: 2022-10-19

## 2022-10-19 NOTE — ED Triage Notes (Signed)
Patient c/o intermittent left chest pain and SOB.

## 2022-10-19 NOTE — ED Provider Notes (Signed)
[facility] DEPT Provider Note   CSN: [id number] Arrival date & time: 10/19/22  1301     History  Chief Complaint  Patient presents with   Chest Pain   Shortness of Breath    Destiny Ware is a 33 y.o. female.  Patient is a 32 year old female with no significant past medical history presenting to the emergency department with chest pain and shortness of breath.  Patient states that she has had a sharp midsternal chest pain that has been coming and going over the past 2 to 3 days.  She states that today her pain was more constant which prompted her to come to the emergency department.  She states she has associated shortness of breath that is constant.  She states that her pain gets worse with any type of movement and does not seem to be worse with exertion, lying flat or eating.  She denies any associated nausea or vomiting, fevers or chills, cough or congestion.  She denies any history of blood clots, lower extremity swelling, recent hospitalizations or surgeries, recent long travels in the car or plane, hormone use or cancer history.  The history is provided by the patient.  Chest Pain Associated symptoms: shortness of breath   Shortness of Breath Associated symptoms: chest pain        Home Medications Prior to Admission medications   Medication Sig Start Date End Date Taking? Authorizing Provider  albuterol (VENTOLIN HFA) 108 (90 Base) MCG/ACT inhaler Inhale 2 puffs into the lungs every 6 (six) hours as needed for wheezing or shortness of breath. 06/23/20   Minette Brine, FNP  benzonatate (TESSALON) 100 MG capsule Take 1 capsule (100 mg total) by mouth 3 (three) times daily as needed. 01/13/22   Mar Daring, PA-C  ipratropium (ATROVENT) 0.03 % nasal spray Place 2 sprays into both nostrils every 12 (twelve) hours. 01/13/22   Mar Daring, PA-C      Allergies    Patient has no known allergies.    Review of Systems   Review of Systems   Respiratory:  Positive for shortness of breath.   Cardiovascular:  Positive for chest pain.    Physical Exam Updated Vital Signs BP (!) 130/50 (BP Location: Left Arm)   Pulse 78   Temp 98.7 F (37.1 C) (Oral)   Resp 16   Ht 5\' 2"  (1.575 m)   Wt 78 kg   LMP 10/09/2022 (Approximate)   SpO2 100%   BMI 31.46 kg/m  Physical Exam Vitals and nursing note reviewed.  Constitutional:      General: She is not in acute distress.    Appearance: She is well-developed.  HENT:     Head: Normocephalic and atraumatic.  Eyes:     Extraocular Movements: Extraocular movements intact.  Cardiovascular:     Rate and Rhythm: Normal rate and regular rhythm.     Pulses:          Radial pulses are 2+ on the right side and 2+ on the left side.     Heart sounds: Normal heart sounds.  Pulmonary:     Effort: Pulmonary effort is normal.     Breath sounds: Normal breath sounds.  Chest:     Chest wall: Tenderness (Diffuse) present.  Abdominal:     Palpations: Abdomen is soft.  Musculoskeletal:        General: Normal range of motion.     Cervical back: Normal range of motion and neck supple.  Right lower leg: No edema.     Left lower leg: No edema.  Skin:    General: Skin is warm and dry.  Neurological:     General: No focal deficit present.     Mental Status: She is alert and oriented to person, place, and time.     ED Results / Procedures / Treatments   Labs (all labs ordered are listed, but only abnormal results are displayed) Labs Reviewed  BASIC METABOLIC PANEL  CBC  I-STAT BETA HCG BLOOD, ED ([facility], [facility], [facility] ONLY)  TROPONIN I (HIGH SENSITIVITY)    EKG EKG Interpretation  Date/Time:  Thursday October 19 2022 13:34:08 EST Ventricular Rate:  74 PR Interval:  121 QRS Duration: 82 QT Interval:  374 QTC Calculation: 415 R Axis:   61 Text Interpretation: Sinus rhythm Borderline T wave abnormalities No previous ECGs available Confirmed by Elayne Snare ([id number]) on 10/19/2022 4:52:57  PM  Radiology DG Chest 2 View  Result Date: 10/19/2022 CLINICAL DATA:  Chest pain EXAM: CHEST - 2 VIEW COMPARISON:  None FINDINGS: The heart size and mediastinal contours are within normal limits. Both lungs are clear. The visualized skeletal structures are unremarkable. IMPRESSION: No active cardiopulmonary disease. Electronically Signed   By: Lorenza Cambridge M.D.   On: 10/19/2022 14:19    Procedures Procedures    Medications Ordered in ED Medications  acetaminophen (TYLENOL) tablet 650 mg (has no administration in time range)  ibuprofen (ADVIL) tablet 400 mg (has no administration in time range)    ED Course/ Medical Decision Making/ A&P                           Medical Decision Making This patient presents to the ED with chief complaint(s) of chest pain, SOB with no pertinent past medical history which further complicates the presenting complaint. The complaint involves an extensive differential diagnosis and also carries with it a high risk of complications and morbidity.    The differential diagnosis includes ACS, arrhythmia, anemia, pulmonary edema, pleural effusion, pneumonia, pneumothorax, viral syndrome less likely as patient has had no fever cough or congestion, PE unlikely as patient is PERC negative  Additional history obtained: Additional history obtained from N/A Records reviewed N/A  ED Course and Reassessment: Patient was initially evaluated by provider in triage and had EKG, labs and chest x-ray performed.  The patient's EKG is no acute ischemic changes and her troponin is negative.  Due to her ongoing chest pain for the past 3 days, single troponin is sufficient.  Remainder of labs are within normal range.  Chest x-ray is without acute disease.  She does have significant chest wall tenderness concerning for possible costochondritis versus GERD.  She will be treated with Tylenol and Motrin and recommended close primary care follow-up.  She was given strict return  precautions.  Independent labs interpretation:  The following labs were independently interpreted: Within normal range  Independent visualization of imaging: - I independently visualized the following imaging with scope of interpretation limited to determining acute life threatening conditions related to emergency care: Chest x-ray, which revealed no acute disease  Consultation: - Consulted or discussed management/test interpretation w/ external professional: N/A  Consideration for admission or further workup: Patient has no emergent conditions requiring admission or further work-up at this time and is stable for discharge home with primary care follow-up  Social Determinants of health: N/A    Risk OTC drugs.  Final Clinical Impression(s) / ED Diagnoses Final diagnoses:  Chest wall pain  Shortness of breath    Rx / DC Orders ED Discharge Orders     None         Kemper Durie, DO 10/19/22 1708

## 2022-10-19 NOTE — Discharge Instructions (Signed)
You were seen in the emergency department for your chest pain and shortness of breath.  Your workup showed no signs of heart attack, fluid on your lungs, pneumonia, anemia or abnormal heart rhythms.  It is unclear what is causing your symptoms at this time but you may have some inflammation of your chest wall called costochondritis and you should take Tylenol and Motrin as needed for pain.  Both can be taken up to every 6 hours.  You should follow-up with your primary doctor in the next few days to have your symptoms rechecked.  You should return to the emergency department for significantly worsening chest pain, severe shortness of breath, if you pass out or if you have any other new or concerning symptoms.

## 2022-10-19 NOTE — ED Provider Triage Note (Signed)
Emergency Medicine Provider Triage Evaluation Note  Destiny Ware , a 32 y.o. female  was evaluated in triage.  Patient complains of 4 days worth of severe chest pain.  Reproducible.  Did not lift anything.  PERC negative.  Never had this before.  Feels tight  Review of Systems  Positive:  Negative:   Physical Exam  BP (!) 130/50 (BP Location: Left Arm)   Pulse 78   Temp 98.7 F (37.1 C) (Oral)   Resp 16   SpO2 100%  Gen:   Awake, no distress   Resp:  Normal effort  MSK:   Moves extremities without difficulty  Other:  Reproducible chest pain in all areas of the anterior chest, RRR  Medical Decision Making  Medically screening exam initiated at 1:49 PM.  Appropriate orders placed.  Leslee Home was informed that the remainder of the evaluation will be completed by another provider, this initial triage assessment does not replace that evaluation, and the importance of remaining in the ED until their evaluation is complete.     Rhae Hammock, PA-C 10/19/22 1349

## 2022-10-23 ENCOUNTER — Telehealth: Payer: Self-pay

## 2022-10-23 NOTE — Telephone Encounter (Signed)
Transition Care Management Unsuccessful Follow-up Telephone Call  Date of discharge and from where:  10/19/2022 [facility]   Attempts:  1st Attempt  Reason for unsuccessful TCM follow-up call:  Left voice message

## 2023-03-30 IMAGING — US US CAROTID DUPLEX BILAT
1 series · 13 of 24 positions shown · non-contrast
Comparison: None.

CLINICAL DATA: hypertensive retinopathy, visual disturbance

EXAM:
BILATERAL CAROTID DUPLEX ULTRASOUND
TECHNIQUE: Gray scale imaging, color Doppler and duplex ultrasound were
performed of bilateral carotid and vertebral arteries in the neck.

[Series 1: us carotid duplex bilat · 0.06mm/px · 13 of 72 slices shown]
[im 1/72]
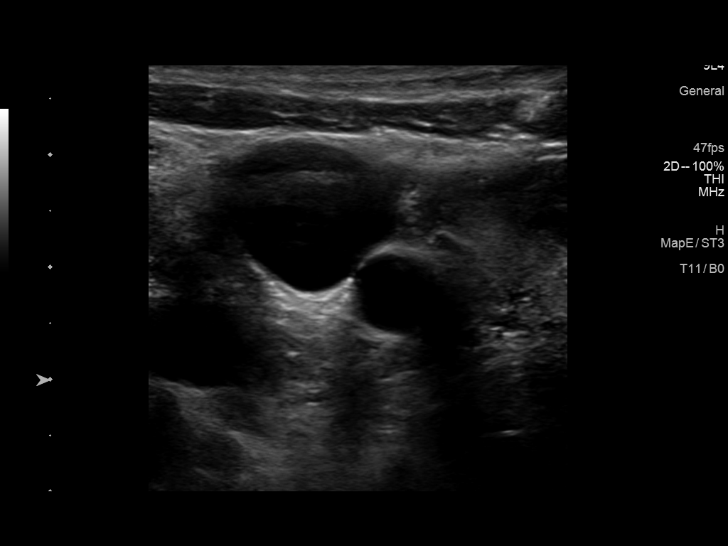
[im 7/72]
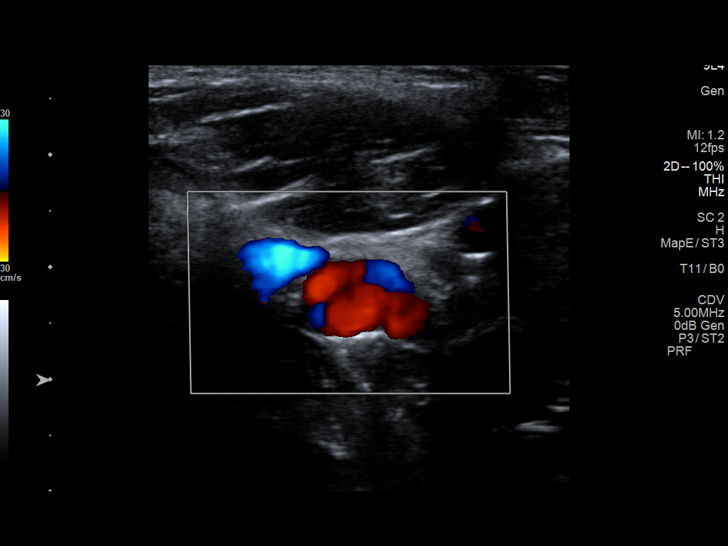
[im 13/72]
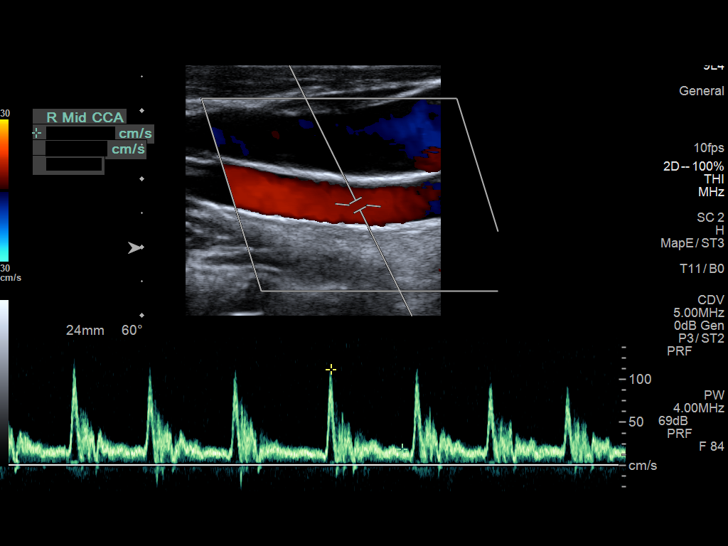
[im 19/72]
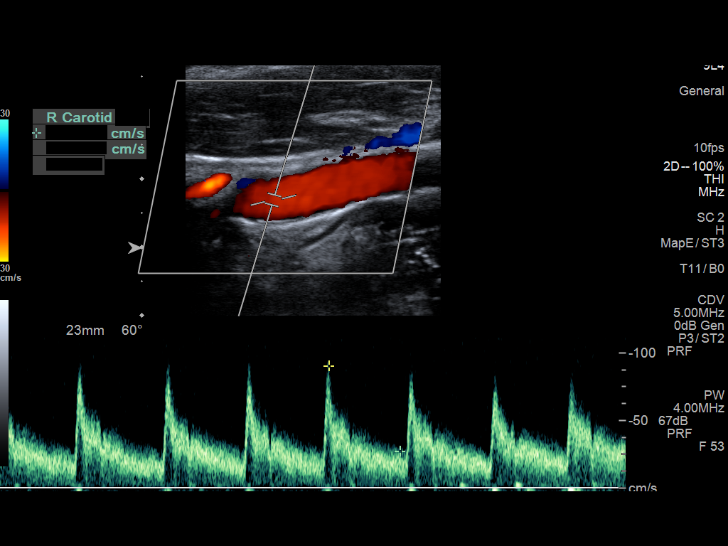
[im 25/72]
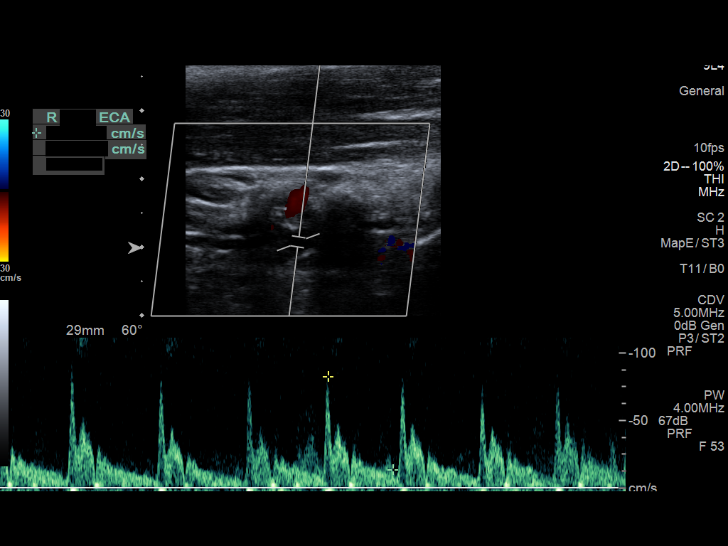
[im 31/72]
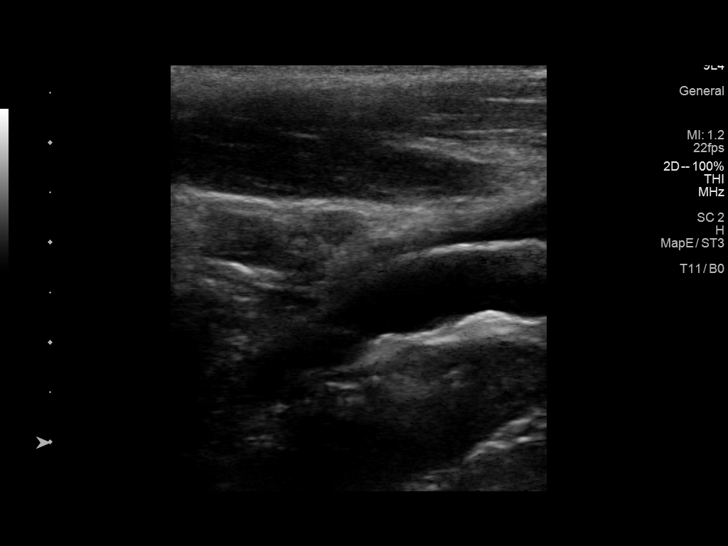
[im 38/72]
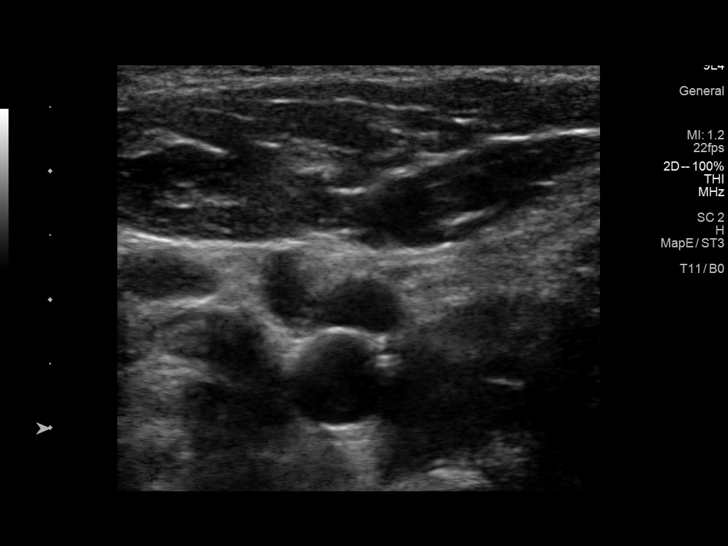
[im 41/72]
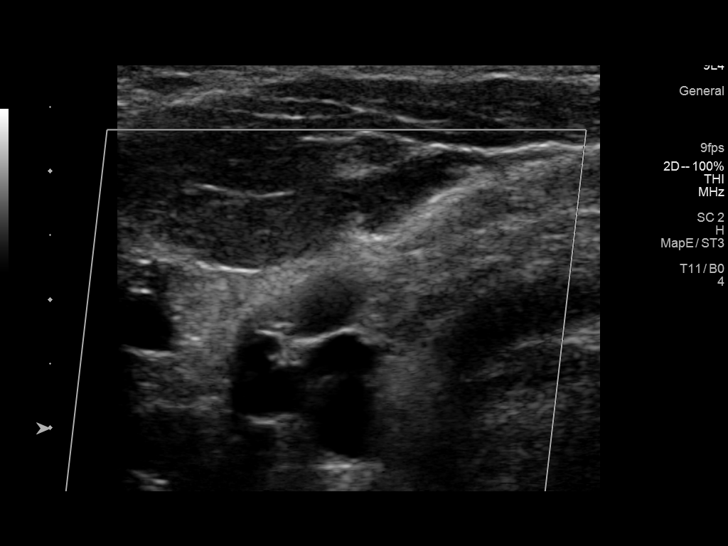
[im 47/72]
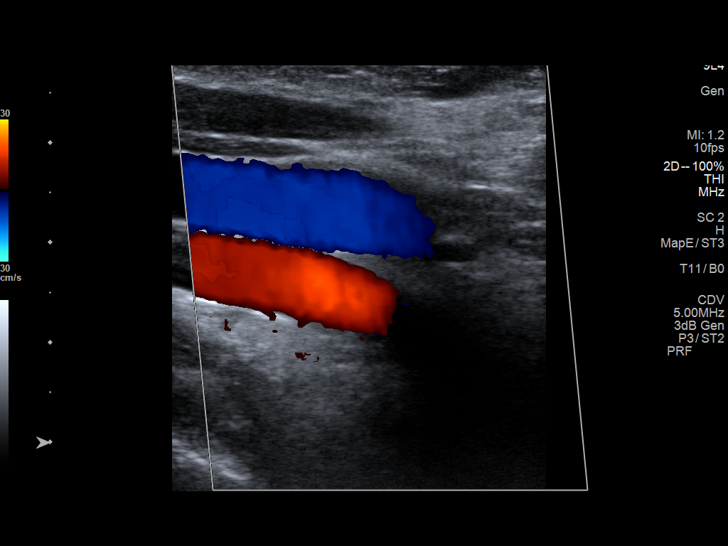
[im 53/72]
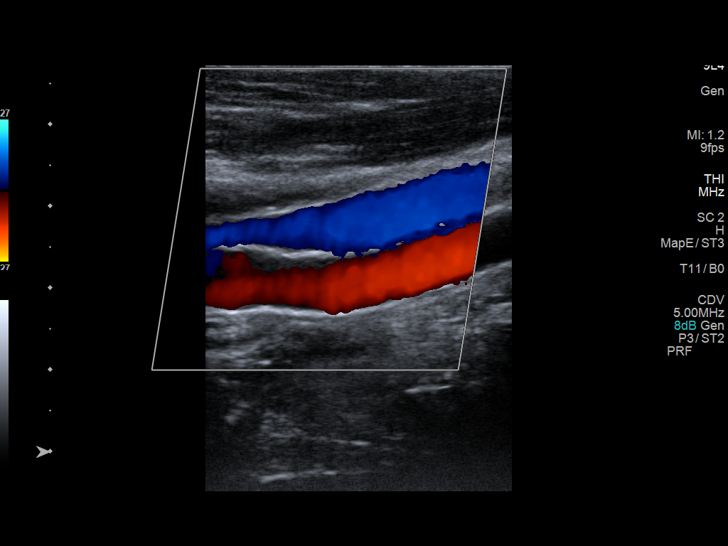
[im 59/72]
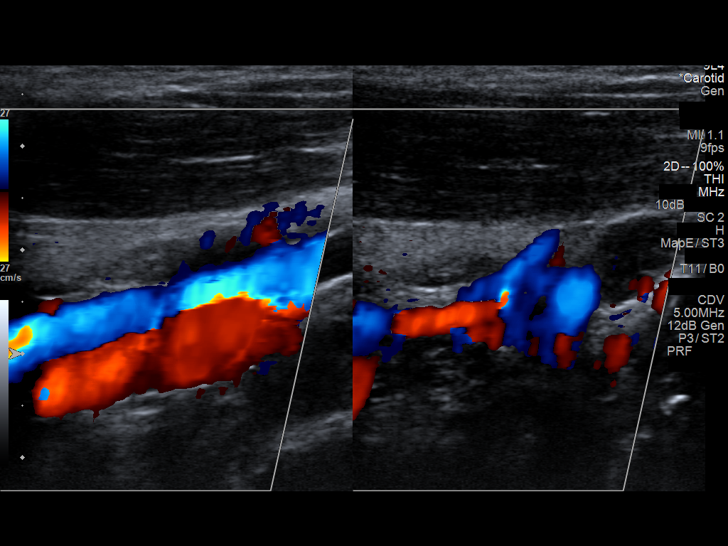
[im 65/72]
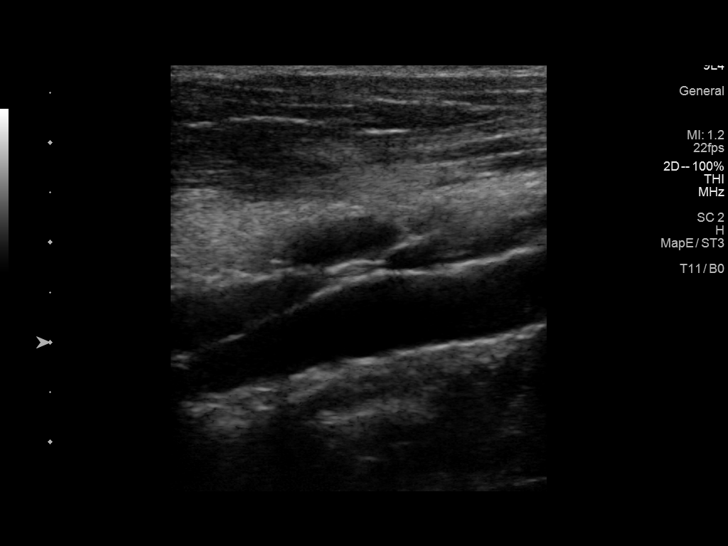
[im 72/72]
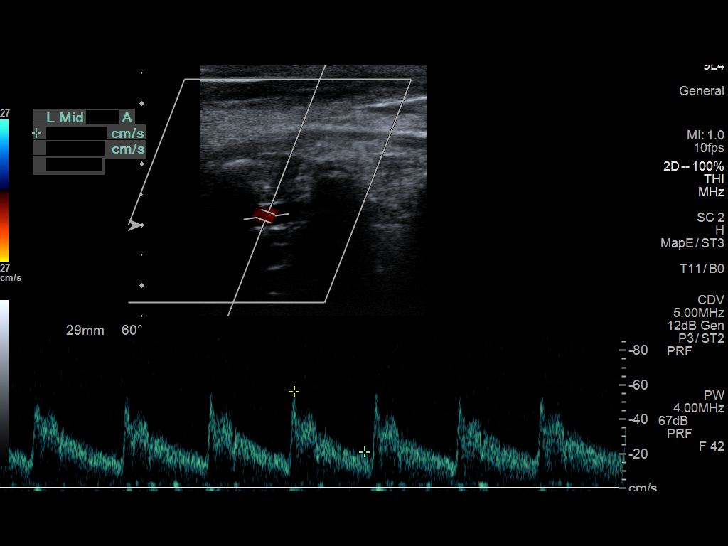

[13 of 24 positions shown; findings below may reference images not displayed]

FINDINGS: Criteria: Quantification of carotid stenosis is based on velocity
parameters that correlate the residual internal carotid diameter
with NASCET-based stenosis levels, using the diameter of the distal
internal carotid lumen as the denominator for stenosis measurement.

The following velocity measurements were obtained:

RIGHT

ICA: 77/31 cm/sec

CCA: 126/23 cm/sec

SYSTOLIC ICA/CCA RATIO:

ECA: 83 cm/sec

LEFT

ICA: 87/40 cm/sec

CCA: 123/21 cm/sec

SYSTOLIC ICA/CCA RATIO:

ECA: 102 cm/sec

RIGHT CAROTID ARTERY: No significant atherosclerosis. Negative for
stenosis. No waveform or velocity abnormality.

RIGHT VERTEBRAL ARTERY:  Normal antegrade flow

LEFT CAROTID ARTERY: Huge no significant atherosclerosis. Negative
for stenosis. No waveform or velocity abnormality.

LEFT VERTEBRAL ARTERY:  Normal antegrade flow

Upper extremity blood pressures: RIGHT: 106/60 LEFT: 110/64

Nonspecific prominent left cervical lymph nodes measuring up to
cm. Some of these left cervical lymph nodes appear mildly enlarged
and hyperechoic with loss of normal architecture measuring up to
cm.
IMPRESSION: Negative for carotid stenosis.  Normal exam for age.

Nonspecific mild left cervical adenopathy. Consider follow-up neck
CT with contrast for further evaluation.

## 2023-04-29 IMAGING — US US ABDOMEN LIMITED
1 series · 14 of 25 positions shown · non-contrast
Comparison: None

CLINICAL DATA: Nausea vomiting for 1 week in a 30-year-old female.

EXAM:
ULTRASOUND ABDOMEN LIMITED RIGHT UPPER QUADRANT

[Series 1: us abdomen limited · 0.23mm/px · 14 of 36 slices shown]
[im 1/36]
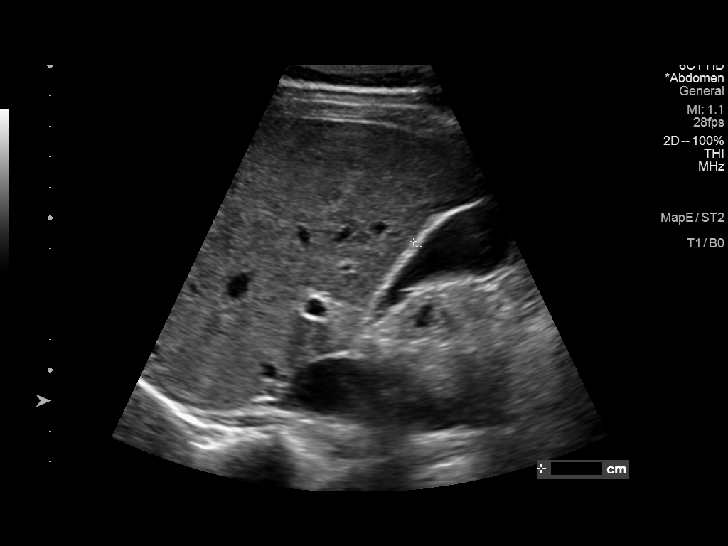
[im 3/36]
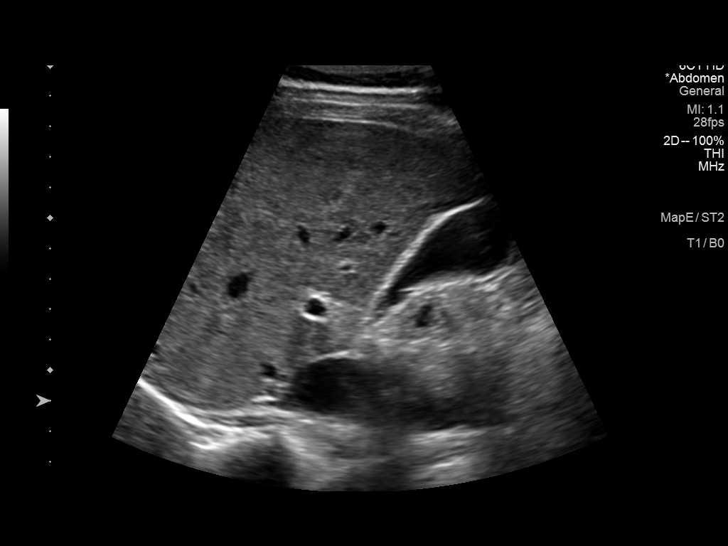
[im 6/36]
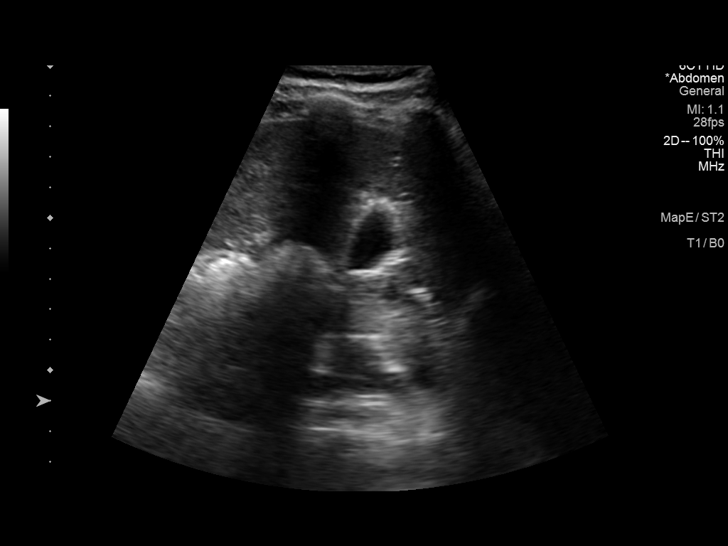
[im 9/36]
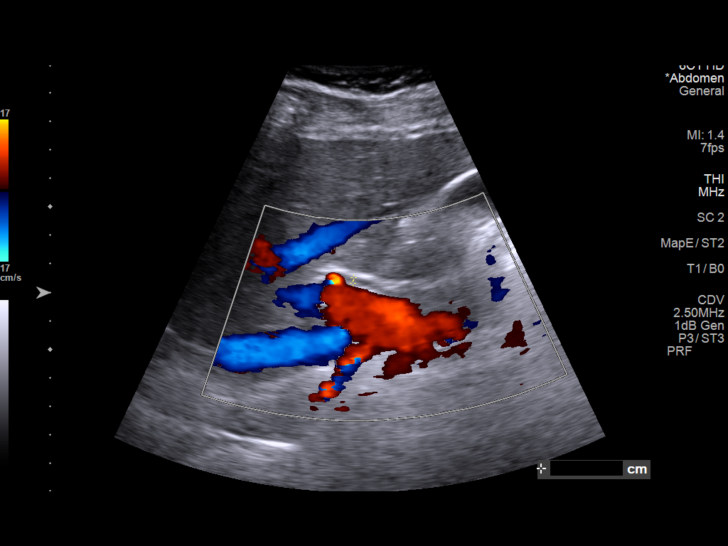
[im 12/36]
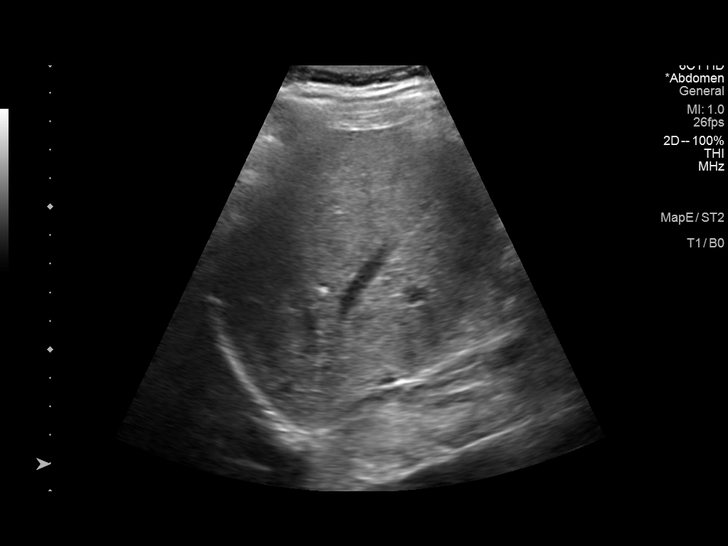
[im 14/36]
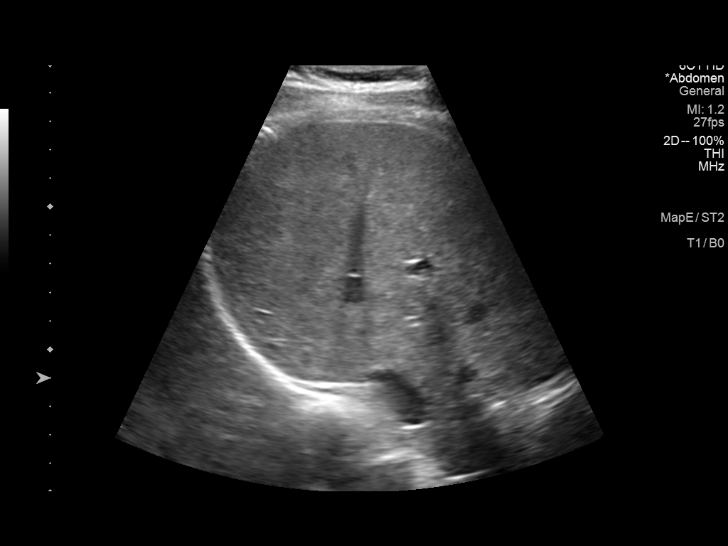
[im 17/36]
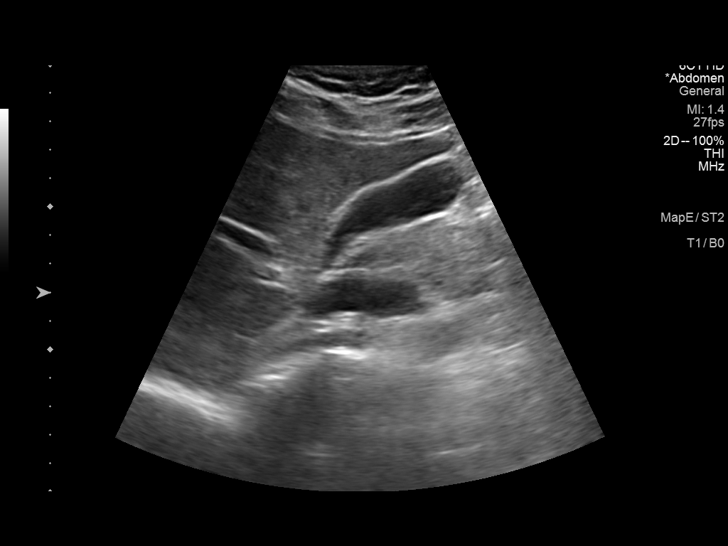
[im 19/36]
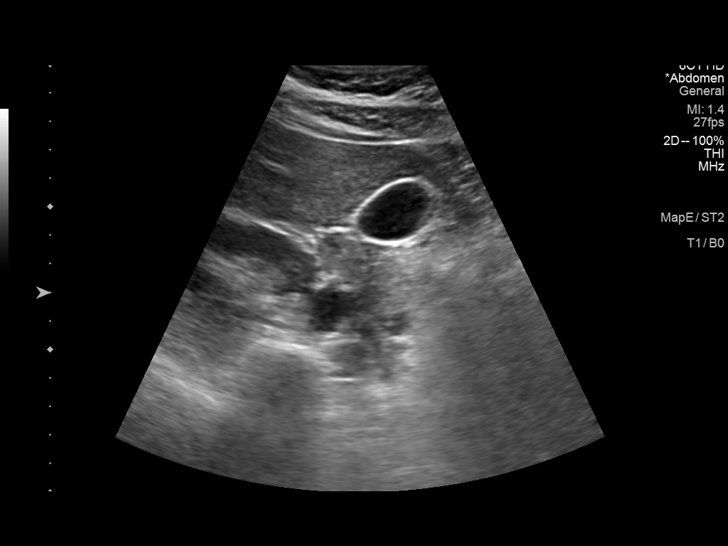
[im 22/36]
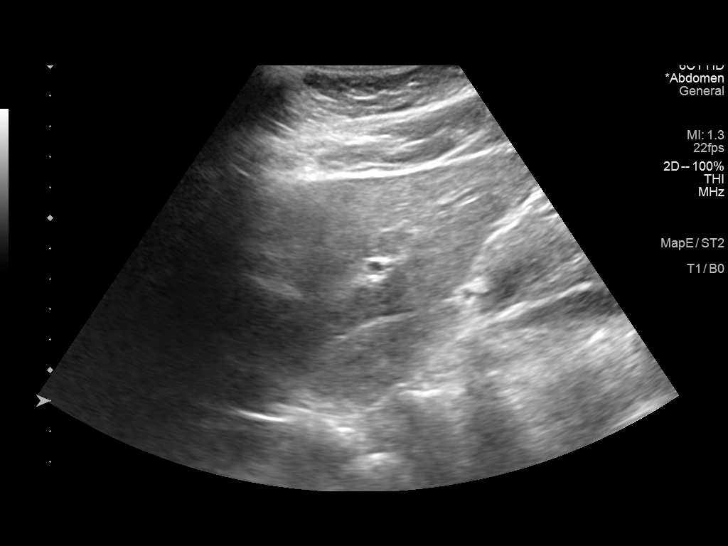
[im 24/36]
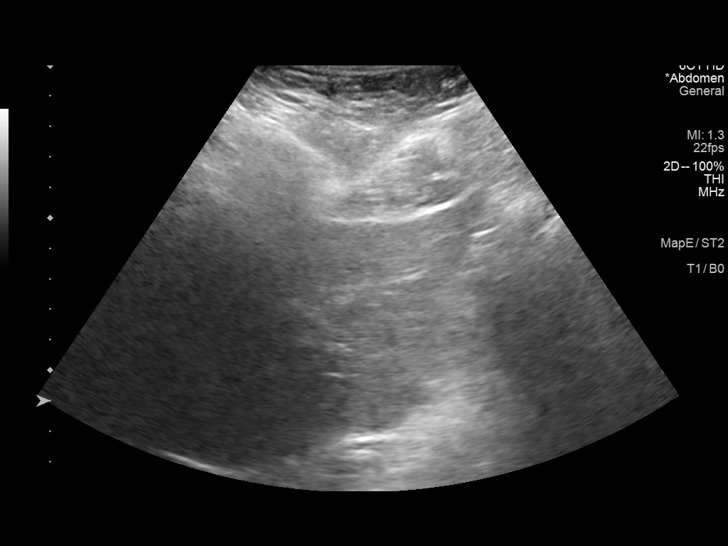
[im 27/36]
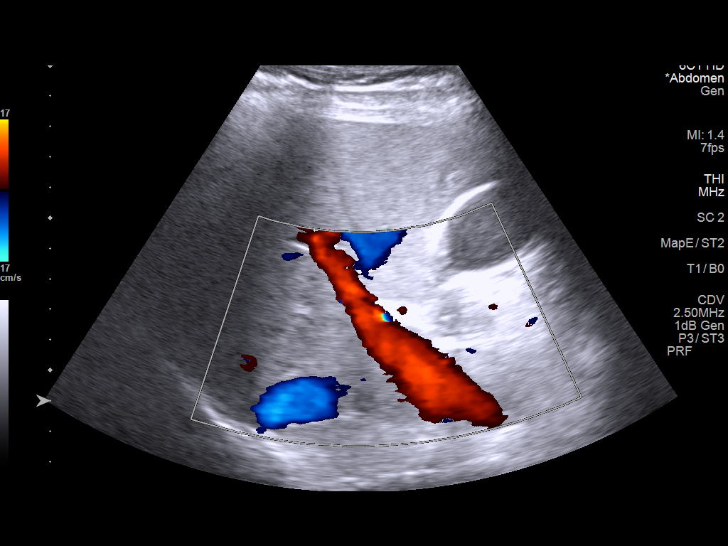
[im 30/36]
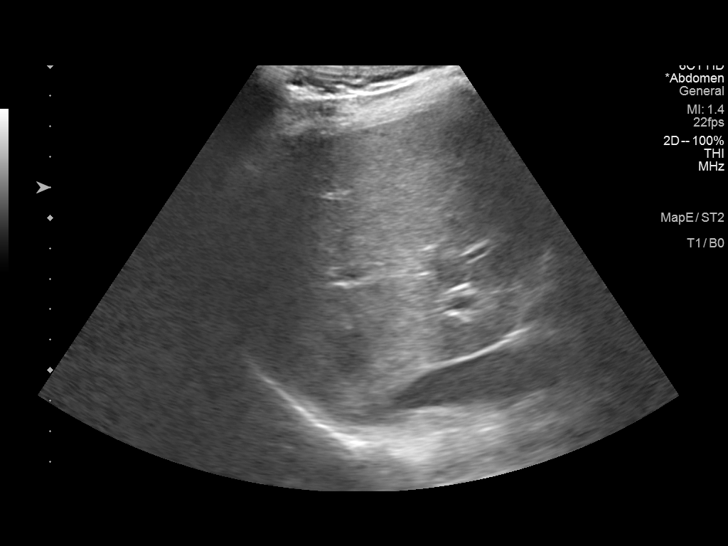
[im 33/36]
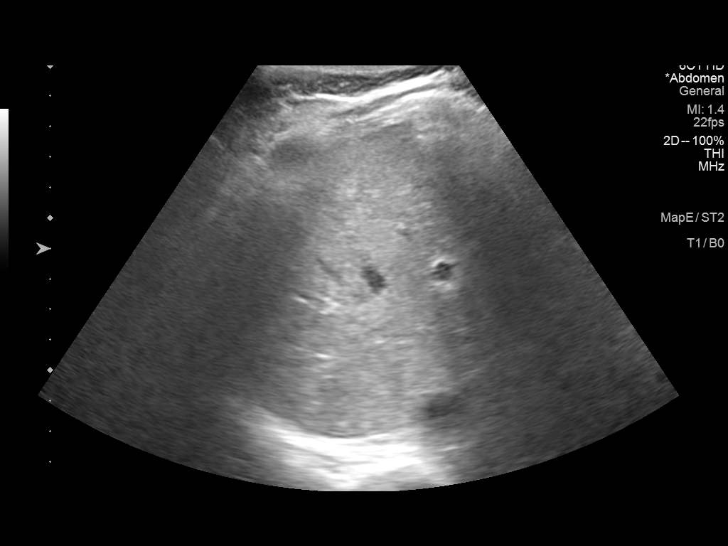
[im 36/36]
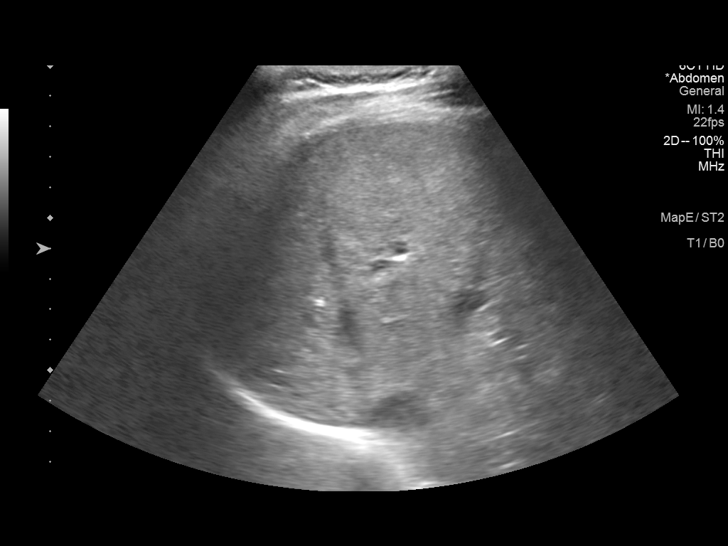

[14 of 25 positions shown; findings below may reference images not displayed]

FINDINGS: Gallbladder:

No gallstones or wall thickening visualized. No sonographic Murphy
sign noted by sonographer.

Common bile duct:

Diameter: 2.0 mm

Liver:

No focal lesion identified. No sign of increased parenchymal
echogenicity. Question of mildly coarsened echotexture. Portal vein
is patent on color Doppler imaging with normal direction of blood
flow towards the liver.

Other: None.
IMPRESSION: No acute biliary findings.

Question of mildly coarsened hepatic echogenicity versus scan
related artifact. Correlation with hepatic enzymes may be helpful.

## 2024-03-06 ENCOUNTER — Ambulatory Visit: Payer: Self-pay
